# Patient Record
Sex: Female | Born: 1945 | Race: White | Hispanic: No | Marital: Married
Health system: Midwestern US, Academic
[De-identification: ages and names within clinical notes are randomized; demographics above are authoritative.]

---

## 2019-04-27 MED ORDER — LACTATED RINGERS IV SOLP
1000 mL | INTRAVENOUS | 0 refills | Status: CN
Start: 2019-04-27 — End: ?

## 2019-05-07 ENCOUNTER — Encounter: Admit: 2019-05-07 | Discharge: 2019-05-07 | Payer: MEDICARE

## 2019-05-07 ENCOUNTER — Ambulatory Visit: Admit: 2019-05-07 | Discharge: 2019-05-07 | Payer: MEDICARE

## 2019-05-07 MED ORDER — FENTANYL CITRATE (PF) 50 MCG/ML IJ SOLN
0 refills | Status: DC
Start: 2019-05-07 — End: 2019-05-07
  Administered 2019-05-07: 17:00:00 25 ug via INTRAVENOUS

## 2019-05-07 MED ORDER — LACTATED RINGERS IV SOLP
1000 mL | INTRAVENOUS | 0 refills | Status: DC
Start: 2019-05-07 — End: 2019-05-07
  Administered 2019-05-07: 16:00:00 1000.000 mL via INTRAVENOUS

## 2019-05-07 MED ORDER — PROPOFOL 10 MG/ML IV EMUL 20 ML (INFUSION)(AM)(OR)
INTRAVENOUS | 0 refills | Status: DC
Start: 2019-05-07 — End: 2019-05-07
  Administered 2019-05-07: 17:00:00 125 ug/kg/min via INTRAVENOUS

## 2019-05-07 MED ORDER — LIDOCAINE (PF) 200 MG/10 ML (2 %) IJ SYRG
0 refills | Status: DC
Start: 2019-05-07 — End: 2019-05-07
  Administered 2019-05-07: 17:00:00 80 mg via INTRAVENOUS

## 2019-05-07 MED ORDER — ACETAMINOPHEN/LIDOCAINE/ANTACID DS(#) 1:1:3  PO SUSP
30 mL | Freq: Once | ORAL | 0 refills | Status: CP
Start: 2019-05-07 — End: ?
  Administered 2019-05-07: 19:00:00 30 mL via ORAL

## 2019-05-07 NOTE — Anesthesia Post-Procedure Evaluation
Post-Anesthesia Evaluation    Name: Cassandra Wallace      MRN: [id number]     DOB: 05/28/1945     Age: 74 y.o.     Sex: female   __________________________________________________________________________     Procedure Information     Anesthesia Start Date/Time: 05/07/19 1133    Procedure: ESOPHAGOGASTRODUODENOSCOPY WITH TRANSENDOSCOPIC ULTRASOUND GUIDED FINE NEEDLE ASPIRATION/ BIOPSY (N/A )    Location: ENDO 1 / ENDO/GI    Surgeons: Comer Locket, MD          Post-Anesthesia Vitals  BP: 142/82 (05/06 1330)  Temp: 36.8 ?C (98.2 ?F) (05/06 1248)  Pulse: 63 (05/06 1330)  Respirations: 16 PER MINUTE (05/06 1330)  SpO2: 98 % (05/06 1330)  SpO2 Pulse: 64 (05/06 1330)   Vitals Value Taken Time   BP 142/82 05/07/19 1330   Temp 36.8 ?C (98.2 ?F) 05/07/19 1248   Pulse 63 05/07/19 1330   Respirations 16 PER MINUTE 05/07/19 1330   SpO2 98 % 05/07/19 1330         Post Anesthesia Evaluation Note    Evaluation location: other  Patient participation: recovered; patient participated in evaluation  Level of consciousness: alert    Pain score: 0  Pain management: adequate    Hydration: normovolemia  Temperature: 36.0?C - 38.4?C  Airway patency: adequate    Perioperative Events       Post-op nausea and vomiting: no PONV    Postoperative Status  Cardiovascular status: hemodynamically stable  Respiratory status: spontaneous ventilation        Perioperative Events  Perioperative Event: No  Emergency Case Activation: No

## 2019-05-07 NOTE — Anesthesia Pre-Procedure Evaluation
Anesthesia Pre-Procedure Evaluation    Name: Cassandra Wallace      MRN: [id number]     DOB: [birth year]     Age: 74 y.o.     Sex: female   _________________________________________________________________________     Procedure Info:   Procedure Information     Date/Time: 05/07/19 1100    Procedure: ESOPHAGOGASTRODUODENOSCOPY WITH TRANSENDOSCOPIC ULTRASOUND GUIDED FINE NEEDLE ASPIRATION/ BIOPSY (N/A )    Location: ENDO 1 / ENDO/GI    Surgeons: Comer Locket, MD          Physical Assessment  Vital Signs (last filed in past 24 hours):         Patient History   No Known Allergies     Current Medications    Not on File         Review of Systems/Medical History        PONV Screening: Female gender and Non-smoker  No history of anesthetic complications  No family history of anesthetic complications      Airway - negative        Pulmonary - negative      Not a current smoker        No recent URI      Cardiovascular         Exercise tolerance: >4 METS      No hypertension,       No past MI,       No hx of coronary artery disease      No angina      GI/Hepatic/Renal         No hx of liver disease      No nausea      No vomiting      Pancreatic and liver masses seen on imaging at outside hospital - suspicious for cancer        Neuro/Psych       No seizures      No CVA      Parkinson's Disease (stable for past 5 years)        Endocrine/Other - negative      Constitution - negative   Physical Exam    Airway Findings      Mallampati: I      TM distance: >3 FB      Neck ROM: full      Mouth opening: good    Dental Findings: Negative      Cardiovascular Findings:       Rhythm: regular      Rate: normal      No murmur    Pulmonary Findings:       Breath sounds clear to auscultation.    Neurological Findings:       Alert and oriented x 3    Constitutional findings: Negative       Diagnostic Tests  Hematology: No results found for: HGB, HCT, PLTCT, WBC, NEUT, ANC, LYMPH, ALC, ABSLYMPHCT, MONA, AMC, EOSA, ABC, BASOPHILS, MCV, MCH, MCHC, MPV, RDW      General Chemistry: No results found for: NA, K, CL, CO2, GAP, BUN, CR, GLU, CA, KETONES, ALBUMIN, LACTIC, OBSCA, MG, TOTBILI, TOTBILCB, PO4   Coagulation: No results found for: PT, PTT, INR      Anesthesia Plan    ASA score: 2   Plan: MAC  Induction method: intravenous  NPO status: acceptable      Informed Consent  Anesthetic plan and risks discussed with patient.  Use of blood products discussed with patient  Blood Consent: consented

## 2019-05-08 ENCOUNTER — Encounter: Admit: 2019-05-08 | Discharge: 2019-05-08 | Payer: MEDICARE

## 2019-05-09 ENCOUNTER — Encounter: Admit: 2019-05-09 | Discharge: 2019-05-09 | Payer: MEDICARE

## 2019-05-09 NOTE — Progress Notes
Name: Cassandra Wallace          MRN: [id number]      DOB: 1945/06/29      AGE: 74 y.o.   DATE OF SERVICE: 05/11/2019    Subjective:             Reason for Visit:  New CA Pt      Cassandra Wallace is a 74 y.o. female.     Cancer Staging  Pancreatic cancer metastasized to liver [facility])  Staging form: Exocrine Pancreas, AJCC 8th Edition  - Clinical stage from 05/09/2019: Stage IV (cT4, cN0, pM1) - Signed by Lady Deutscher, MD on 05/09/2019      Cassandra Wallace is here for consult regarding her recent finding of adenocarcinoma from the pancreatic and liver biopsies.    Lyrica is being evaluated for abdominal pain and constipation and unintentional weight loss.  Unfortunately a CT of the abdomen and pelvis done in [place] showed pancreatic lesions as well as 3 right hepatic lesions.  An MRI saw the same lesions.  An EUS performed on May 07, 2019 at [facility] unfortunately found to abnormal lesions in the left lobe of the liver and a pancreatic mass in the neck the pancreas was invading the superior mesenteric artery.  Biopsies of both came back consistent with adenocarcinoma.  A CA 19-9 came back as [name] which is elevated.  Her chemistries were normal.  We note that her microsatellite instability tests are not back yet.    Cassandra Wallace is here with her husband Cassandra Wallace and her son Cassandra Wallace, who is a Land here in town, participated by phone.  I believe there are other children in the family.    I discussed the finding of adenocarcinoma in the pancreas and the liver.  I discussed that this is stage IV and not generally considered curable.  I did discuss that sometimes we can slow the cancer on help people live longer with systemic chemotherapy.  I discussed that at this time radiation therapy and surgery would probably not help her live longer do better though there might be a role in the future in certain circumstances.  I discussed several types of systemic therapy.  We discussed no therapy, Gemzar single agent with a minimal response rate and probably minimal survival benefit, a recipe of Gemzar and Abraxane with a 20% response rate, and also modified FOLFIRINOX with a much higher response rate though with much more potential risks and side effects.  This patient is very healthy for her age so to speak.  I think she could probably tolerate a dose adjusted FOLFIRINOX well and get more benefit.  We talked in general about the risk for infections, low blood counts, fatigue, neuropathy, mucositis, dyspepsia and nausea, and hair loss.  We also discussed that we will arrange for a teaching session to go over these in more potential side effects as far as educational plan.  We also discussed the need for a PowerPort placement.  We note that the patient and her husband plan to move here from [facility].    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  He mostly worked in [place].  His name is Czechoslovakian/Bohemian.  We talked about ethnic foods.  Cassandra Wallace on the other hand is originally [place] and her ethnicity.  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  Cassandra Wallace enjoys plan to move here from [place] and breast and will probably be several miles from the office.    In addition to the history of metastatic pancreatic cancer diagnosis the patient also has a history of Parkinson's though she has been very stable.    The patient and her son and husband asked several questions.  They are in agreement with setting things in motion for a PowerPort placement, chemotherapy insurance acquisition and setting things in motion begin chemotherapy hopefully next week.  We talked about following her tumor marker and CAT scan to assess response.  They will call if they have more questions.           Review of Systems   Constitutional: Positive for appetite change and unexpected weight change. Negative for chills and fever.   HENT: Negative for nosebleeds.    Eyes: Negative for pain and visual disturbance.   Respiratory: Negative for cough and shortness of breath.    Cardiovascular: Negative for chest pain and leg swelling.   Gastrointestinal: Positive for constipation. Negative for abdominal pain.   Genitourinary: Negative for hematuria.   Musculoskeletal: Negative for joint swelling and neck pain.   Skin: Negative for rash.   Neurological: Negative for dizziness and weakness.   Hematological: Negative for adenopathy.   Psychiatric/Behavioral: Negative for confusion.         Objective:         ? carbidopa/levodopa (SINEMET) 25/100 mg tablet Take 1 tablet by mouth three times daily.     Vitals:    05/11/19 1345   BP: 132/59   BP Source: Arm, Left Upper   Patient Position: Sitting   Pulse: 95   Resp: 16   Temp: 36.7 ?C (98 ?F)   TempSrc: Oral   SpO2: 100%   Weight: 70.9 kg (156 lb 6.4 oz)   Height: 161.3 cm (63.5)   PainSc: Six     Body mass index is 27.27 kg/m?Marland Kitchen     Medical History:   Diagnosis Date   ? Back pain    ? Parkinson's disease (HCC) 2016     Surgical History:   Procedure Laterality Date   ? CATARACT REMOVAL Bilateral 2020   ? ESOPHAGOGASTRODUODENOSCOPY WITH TRANSENDOSCOPIC ULTRASOUND GUIDED FINE NEEDLE ASPIRATION/ BIOPSY N/A 05/07/2019    Performed by Comer Locket, MD at [facility] ENDO      Social History     Tobacco Use   ? Smoking status: Never Smoker   ? Smokeless tobacco: Never Used   Substance Use Topics   ? Alcohol Use     Frequency: Never   ? Drug use: Never     Family History   Problem Relation Age of Onset   ? Cancer-Uterine Mother    ? Arthritis-rheumatoid Father          Pain Score: Six  Pain Loc: Back    Fatigue Scale: 0-None    Pain Addressed:  Patient declines intervention    Patient Evaluated for a Clinical Trial: No treatment clinical trial available for this patient.     [name] Cooperative Oncology Group performance status is 0, Fully active, able to carry on all pre-disease performance without restriction.Marland Kitchen     Physical Exam  Vitals signs reviewed.   Constitutional:       Appearance: She is well-developed.   HENT:      Head: Normocephalic and atraumatic.   Eyes:      Conjunctiva/sclera: Conjunctivae normal.   Neck:  Musculoskeletal: Normal range of motion.      Vascular: No JVD.      Trachea: No tracheal deviation.   Cardiovascular:      Rate and Rhythm: Normal rate and regular rhythm.      Heart sounds: No friction rub. No gallop.    Pulmonary:      Effort: Pulmonary effort is normal. No respiratory distress.      Breath sounds: Normal breath sounds. No stridor. No wheezing or rales.   Abdominal:      General: There is no distension.      Palpations: Abdomen is soft. There is no mass.      Tenderness: There is no abdominal tenderness.   Musculoskeletal: Normal range of motion.   Lymphadenopathy:      Head:      Right side of head: No submental or submandibular adenopathy.      Left side of head: No submental or submandibular adenopathy.      Cervical: No cervical adenopathy.      Right cervical: No superficial or posterior cervical adenopathy.     Left cervical: No superficial or posterior cervical adenopathy.      Upper Body:      Right upper body: No supraclavicular or epitrochlear adenopathy.      Left upper body: No supraclavicular or epitrochlear adenopathy.   Skin:     General: Skin is warm and dry.      Findings: No rash.   Neurological:      Mental Status: She is alert and oriented to person, place, and time.      Cranial Nerves: No cranial nerve deficit.   Psychiatric:         Behavior: Behavior normal.         Thought Content: Thought content normal.         Judgment: Judgment normal.               Assessment and Plan:  Patient Active Problem List    Diagnosis Date Noted   ? Pancreatic cancer metastasized to liver [facility]) 05/07/2019     Priority: Medium     04/13/2019 [facility] medical imaging [facility] CT abdomen pelvis with contrast: Abdominal pain constipation unintentional weight loss.  The head of the pancreas is 3 hypodense lesions.  The lesion towards the right lateral side measures 1.8 cm.  Anterior lesion the mild/mid head measures 11.4 cm.  There are 3 right hepatic lesions that are suspicious.  Impression: Multifocal pancreas lesion suspicious for tumor such as adenocarcinoma.  Could also be focal inflammation from subacute or chronic pancreatitis.  04/20/2019 [place] medical imaging [facility] MRI abdomen without and with contrast.  Indeterminate lesions in the pancreatic bed and proximal body.  Pancreatic neoplasm cannot be excluded.  This is compared to CT/12/2019.  Several right and left hepatic lesions are present.  The 2 largest are adjacent to 1 another.  Measuring 2.1 x 1.8 cm.  Concerning for hepatic metastatic lesions.  Heterogeneous process pancreatic head and proximal body the whole area measures approximately 5.2 x 3.5 cm.  It appears to be least some encasement of the associated narrowing of the superior mesenteric vein  04/23/2019 [facility] clinic note.  Patient had Covid in November 2020.  Recent imaging showed spots on pancreas.  History of Parkinson's weight 156 pounds  05/07/2019 [facility] EUS report examination of the left lobe of the liver revealed 2 lesions measuring 1 cm each.  FNA x4 was performed upper  limit result is positive for malignant cells.  Examination of pancreatic revealed a mass in the neck the pancreas with invasion to SMA.  Fine-needle biopsy was obtained and preliminary is all is positive for malignant cells.  Await cytology results and await path results.  05/07/2019 CA 19-9 1782, H 12.3, platelets 120 low, W3.5 low, chemistries normal,  05/07/2019 [facility] pathology/cytology liver US-FNA malignant cells present consistent with adenocarcinoma.  Pancreatic neck EUS FNA: Malignant cells present consistent with adenocarcinoma.  Mismatch repair proteins are ordered and will reported in an addendum.  Microsatellite instability test by PCR is also been ordered and will be reported separately.  Part A contain sufficient tissue for further testing.  05/11/2019 this is patient's first visit with [facility] oncology.  Husband Cassandra Wallace participated in person son Cassandra Wallace who is a Land, by phone.  Discussed biopsy results and also stage and prognosis of stage IV pancreatic cancer.  Talked about treatment options including FOLFIRINOX with growth factor, Gemzar and Abraxane, single agent Gemzar, no therapy/palliative care.  Patient is very good performance status.  Would suggest slightly dose reduced FOLFIRINOX with growth factor.  Patient will think about it but is willing to set things in motion for PowerPort placement and chemotherapy and teaching etc.  They plan to move here from [facility] in the near future.      Lequita Asal, family medicine with [place] [facility]       ? Parkinson's disease (HCC) 05/11/2019     This note was created with partial dictation using a dragon dictation system, please notify us if you notice any errors of omissions or content.

## 2019-05-11 ENCOUNTER — Encounter: Admit: 2019-05-11 | Discharge: 2019-05-11 | Payer: MEDICARE

## 2019-05-11 DIAGNOSIS — C259 Malignant neoplasm of pancreas, unspecified: Secondary | ICD-10-CM

## 2019-05-11 DIAGNOSIS — G20 Parkinson's disease: Secondary | ICD-10-CM

## 2019-05-11 DIAGNOSIS — M549 Dorsalgia, unspecified: Secondary | ICD-10-CM

## 2019-05-11 MED ORDER — IRINOTECAN IVPB
135 mg/m2 | Freq: Once | INTRAVENOUS | 0 refills | Status: CN
Start: 2019-05-11 — End: ?

## 2019-05-11 MED ORDER — OXALIPLATIN IVPB
76.5 mg/m2 | Freq: Once | INTRAVENOUS | 0 refills | Status: CN
Start: 2019-05-11 — End: ?

## 2019-05-11 MED ORDER — PALONOSETRON 0.25 MG/5 ML IV SOLN
.25 mg | Freq: Once | INTRAVENOUS | 0 refills | Status: CN
Start: 2019-05-11 — End: ?

## 2019-05-11 MED ORDER — DEXAMETHASONE 6 MG PO TAB
12 mg | Freq: Once | ORAL | 0 refills | Status: CN
Start: 2019-05-11 — End: ?

## 2019-05-11 MED ORDER — LEUCOVORIN IVPB
360 mg/m2 | Freq: Once | INTRAVENOUS | 0 refills | Status: CN
Start: 2019-05-11 — End: ?

## 2019-05-11 MED ORDER — FLUOROURACIL IV AMB PUMP
2160 mg/m2 | Freq: Once | INTRAVENOUS | 0 refills | Status: CN
Start: 2019-05-11 — End: ?

## 2019-05-11 MED ORDER — ATROPINE 0.4 MG/ML IJ SOLN
0.4 mg | INTRAVENOUS | 0 refills | Status: CN | PRN
Start: 2019-05-11 — End: ?

## 2019-05-11 NOTE — Patient Education
Dear Ms. Cassandra Wallace,     Thank you for choosing [facility] Interventional Radiology for your procedure. Your appointment information is listed below:    Appointment Date: 05/13/19  Appointment Time: 2:00pm   Arrival Time: 1:00pm  Location:   ? [facility]: [address] [place], [place] [address]   Parking: available in the front of the building      INTERVENTIONAL RADIOLOGY  PRE-PROCEDURE INSTRUCTIONS SEDATION    You are scheduled for a procedure in Interventional Radiology with procedural sedation.  Please follow these instructions and any direction from your Primary Care/Managing Physician.  If you have questions about your procedure or need to reschedule please call [phone].    Medication Instructions:   You may take the following medications with a small sip of water:  <ALL MEDICATIONS>       Diet Instructions:  a. (8) hours before your procedure (6:00am), stop your regular diet and start a clear liquid diet.  b. (6) hours before your procedure, discontinue tube feedings and chewing tobacco.  c. (2) hours before your procedure (12:00pm/noon) discontinue clear liquids.  You should have nothing by mouth. This includes GUM or CANDY.     Clear Liquid Diet    Water  Apple or White Grape Juice  Coffee or tea without cream   Tea  White Cranberry Juice  Chicken Bouillon or Broth (no noodles)  Soda Pop  Popsicles   Beef Bouillon or Broth (no noodles)    Day of Exam Instructions:  1. Bathe or shower with an antibacterial soap prior to your appointment.  2. If you have a history of Obstructive Sleep Apnea (OSA) bring your CPAP/BIPAP.   3. Bring a list of your current medications and the dosages.  4. Wear comfortable clothing and leave valuables at home.  5. Arrive (1) hour prior to your appointment.  This time will be spent registering, interviewing, assessing, educating and preparing you for the test.  ? You will be with [place] anywhere from 30 minutes to 6 hours after your exam depending on your procedure.  6. You may be sedated for the procedure. A responsible adult must drive you home (no Benedetto Goad, taxis or buses are allowed) and stay with you overnight. If you do not have a driver we will be unable to perform your procedure.   7. You will not be able to return to work or drive the same day if receiving sedation.

## 2019-05-11 NOTE — Progress Notes
Interventional Radiology Outpatient Scheduling Checklist      1.  Name of Procedure(s):  Portacath       2.  Date of Procedure:   05/13/19      3.  Arrival Time:   1300      4.  Procedure Time:  1400      5.  Correct Procedural Room Assignment:  ICC RM 1      6.  Blood Thinners Triaged and instructed per protocol: Y/N/NA:  NA  Confirmed accurate instructions sent to patient: Y/N:  NA       7.  Procedure Order Verified: Y/N:  Yes      9.  Patient instructed to have a driver: Y/N/NA:  Yes    10.  Patient instructed on NPO status: Y/N/NA:  Yes, 0600 & 1200  Confirmed accurate instructions sent to patient: Y/N:  Yes    11.  Specimen needed: Y/N/NA:  NA   Verified Order placed: Y/N:  Yes    12.  Allergies Verified:  Y/N:  Yes    13.  Is there an Iodine Allergy: Y/N:  No  Does the Procedure Require contrast: Y/N:  Possibly  If so, was the IR- Contrast Allergy Pre-Procedure Medication protocol ordered: Y/NA:  NA    14.  Does the patient have labs according to IR Pre-procedure Laboratory Parameter policy: Y/N/NA: NA  If No, was the patient instructed to obtain labs prior to procedure: Y/N/NA:  NA     15.  Will the patient need to be admitted or have a possible admission: Y/N:  No  If yes, confirmed accurate instructions sent to patient: Y/N/NA:  NA     16.  Patient States Understanding: Y/N:  Yes    17.  History of OSA:  Y/N:  No  If yes, confirm request to bring CPAP sent to patient: Y/N/NA:  NA    18. Patient declines electronic procedure instructions: Y/N:  No, EMAIL.

## 2019-05-13 ENCOUNTER — Encounter: Admit: 2019-05-13 | Discharge: 2019-05-13 | Payer: MEDICARE

## 2019-05-13 ENCOUNTER — Ambulatory Visit: Admit: 2019-05-13 | Discharge: 2019-05-13 | Payer: MEDICARE

## 2019-05-13 DIAGNOSIS — C787 Secondary malignant neoplasm of liver and intrahepatic bile duct: Secondary | ICD-10-CM

## 2019-05-13 DIAGNOSIS — M549 Dorsalgia, unspecified: Secondary | ICD-10-CM

## 2019-05-13 DIAGNOSIS — C259 Malignant neoplasm of pancreas, unspecified: Secondary | ICD-10-CM

## 2019-05-13 DIAGNOSIS — G20 Parkinson's disease: Secondary | ICD-10-CM

## 2019-05-13 MED ORDER — MIDAZOLAM 1 MG/ML IJ SOLN
0 refills | Status: CP
Start: 2019-05-13 — End: ?
  Administered 2019-05-13: 20:00:00 0.5 mg via INTRAVENOUS
  Administered 2019-05-13: 19:00:00 1 mg via INTRAVENOUS

## 2019-05-13 MED ORDER — FENTANYL CITRATE (PF) 50 MCG/ML IJ SOLN
0 refills | Status: CP
Start: 2019-05-13 — End: ?
  Administered 2019-05-13 (×2): 50 ug via INTRAVENOUS

## 2019-05-13 MED ORDER — SODIUM CHLORIDE 0.9 % IV SOLP
0 refills | Status: CP
Start: 2019-05-13 — End: ?
  Administered 2019-05-13: 19:00:00 250 mL via INTRAVENOUS

## 2019-05-13 MED ORDER — MIDAZOLAM 1 MG/ML IJ SOLN
1 mg | Freq: Once | INTRAVENOUS | 0 refills | Status: CP
Start: 2019-05-13 — End: ?
  Administered 2019-05-13: 19:00:00 1 mg via INTRAVENOUS

## 2019-05-13 NOTE — Other
Immediate Post Procedure Note    Date:  05/13/2019                                         Attending Physician:   Val Eagle  Performing Provider:  Dion Saucier, MD    Consent:  Consent obtained from patient.  Time out performed: Consent obtained, correct patient verified, correct procedure verified, correct site verified, patient marked as necessary.  Pre/Post Procedure Diagnosis:  Pancreatic cancer  Indications:  chemo    Anesthesia: Conscious Sedation  Procedure(s):  Right chest port placement  Findings:  Patent right IJ     Estimated Blood Loss:  None/Negligible  Specimen(s) Removed/Disposition:  None  Complications: None  Patient Tolerated Procedure: Well  Post-Procedure Condition:  stable    Dion Saucier, MD  Pager [phone]

## 2019-05-13 NOTE — Progress Notes
Sedation physician present in room. Recent vitals and patient condition reviewed between sedating physician and nurse. Reassessment completed. Determination made to proceed with planned sedation.

## 2019-05-13 NOTE — H&P (View-Only)
Pre Procedure History and Physical/Sedation Plan-OP    Procedure Date: 05/13/2019     Planned Procedure(s):  Chest port placement  Indication:  chemo    __________________________________________________________________    Chief Complaint:  Pancreatic cancer    History of Present Illness: Cassandra Wallace is a 74 y.o. female with metastatic pancreatic cancer.  Patient Active Problem List    Diagnosis Date Noted   ? Parkinson's disease (HCC) 05/11/2019   ? Pancreatic cancer metastasized to liver [facility]) 05/07/2019     Medical History:   Diagnosis Date   ? Back pain    ? Parkinson's disease (HCC) 2016      Surgical History:   Procedure Laterality Date   ? CATARACT REMOVAL Bilateral 2020   ? ESOPHAGOGASTRODUODENOSCOPY WITH TRANSENDOSCOPIC ULTRASOUND GUIDED FINE NEEDLE ASPIRATION/ BIOPSY N/A 05/07/2019    Performed by Comer Locket, MD at [facility] ENDO      Medications Prior to Admission   Medication Sig Dispense Refill Last Dose   ? carbidopa/levodopa (SINEMET) 25/100 mg tablet Take 1 tablet by mouth three times daily.        No Known Allergies    Social History:   Social History     Tobacco Use   ? Smoking status: Never Smoker   ? Smokeless tobacco: Never Used   Substance Use Topics   ? Alcohol Use     Frequency: Never      Family History   Problem Relation Age of Onset   ? Cancer-Uterine Mother    ? Arthritis-rheumatoid Father         Review of Systems  A comprehensive review of systems was negative.    Previous Anesthetic/Sedation History:  No complications    Physical Exam:  Vital Signs: Last Filed In 24 Hours Vital Signs: 24 Hour Range                General appearance: alert and cooperative  Neurologic: Grossly normal  Lungs: clear to auscultation bilaterally  Heart: normal apical impulse  Abdomen: soft, non-tender. Bowel sounds normal. No masses,  no organomegaly  Extremities: extremities normal, atraumatic, no cyanosis or edema      Airway:  airway assessment performed  Mallampati II (soft palate, uvula, fauces visible) Anesthesia Classification:  ASA III (A patient with a severe systemic disease that limits activity, but is not incapacitating)  Mallampati:  Sedation/Medication Plan: Fentanyl, Lidocaine and Midazolam  Discussion/Reviews:  Physician has discussed risks and alternatives of this type of sedation and above planned procedures with patient    Lab/Radiology/Other Diagnostic Tests:  Labs:  Pertinent labs reviewed           Dion Saucier, MD  Pager [phone]

## 2019-05-13 NOTE — Patient Instructions
Interventional Radiology  IMPLANTED PORT PLACEMENT  An implanted port is a small intravenous access device placed completely under the skin through which you may receive chemotherapy, other medications or blood products.? It may also be used to draw blood.? The port consists of a small reservoir that is implanted usually under the skin of your chest.? This reservoir is connected to a catheter that is placed in a ?tunnel? under the skin and ends in a large vein near the center of your chest.? You may also hear it referred to as a ?chest port,? ?power port? or ?portacath.?? A ?power port? is a port that can be used for contrast injections for CT scans.? A ?vortex port??may be?used for photopheresis. In some cases, the port may be placed in the upper arm.  POST-PROCEDURE ACTIVITY:  ? A responsible adult must drive you home.? After receiving sedation or anesthesia, ?you should not drive, operate heavy machinery or do anything that requires concentration for at least 24 hours after receiving sedation.  ? It is recommended that a responsible adult be with you until morning.  ? Do not lift more than 5 lbs. and avoid any strenuous activity affecting the upper body such as pushing, pulling or straining for 10 days.  ? If the port was placed in your arm, do not allow blood pressures to be taken in that arm.  POST-PROCEDURE SITE CARE:  ? You will have a bandage over the incision on your chest and another small bandage at your neck.? Keep the bandages dry.? You may remove the small bandage at your neck in 24 hours and leave open to air.? Remove the bandage over the incision on your chest after 48 hours.  ? After 24 hours, you may shower. ?Keep the site covered and avoid direct water contact to the incision.?  ? After 48 hours, remove dressing to shower. Use antibacterial soap, gently wash the site then pat dry after.  ? There are no stitches to be removed.? Steri-strips (strips of tape) may be used.?? If present the strips will begin to fall off in 7-10 days.? If they remain after 2 weeks, gently remove them.  ? Do not submerge the area underwater for 1 week or until fully healed (no swimming/hot tub, etc.)  ? Be sure your hands are clean when touching near the site.  ? Do not use ointments, creams or powders on the incision.  ? If you are admitted to the hospital, you will be taught to wash with Chlorhexidine (CHG) soap.? This soap reduces germs on your skin and lowers your risk of infection while in the hospital.? It will keep harmful germs off your skin for 24 hours, so it is important to use this soap daily.? The nursing staff will teach you how to shower with this soap.? If you are unable to shower, they will assist you with using it during a bed bath.? It is not necessary to continue using this soap at home.  ? This soap can cause dry skin.? We recommend using lotion that is compatible with CHG after each bath or shower.? The nursing staff can provide you with our recommended lotion in the hospital.  DIET/MEDICATIONS:  ? You may resume your previous diet after the procedure.?  ? If you receive sedation or anesthesia, avoid any foods or beverages containing alcohol for at least 24 hours after the procedure.?  ? Please see the Medication Reconciliation sheet for instructions regarding resuming your home medications.  CALL THE DOCTOR IF:??????????????????  ?  Bright red blood has soaked the bandage.?  ? You have pain not relieved by medication.? Some soreness at the site is to be expected.  ? You have? Chills, body aches, fever greater than 101?F  ? Redness, swelling or warmth at or around the incision?  ? Drainage or pus coming from the incision  ? Increased tenderness around the incision  ? You have opening of the edges of the incision.  ? You have swelling of the face, neck, chest or arm on the side where the port was placed.????  ?  For severe problems such as excessive bleeding, chest pain or shortness of breath, please call 911.  ?  For any of the above symptoms or for problems or concerns related to the procedure,? call? [phone] for Monday-Friday 7-5.? After-hours and weekends, please call? [phone] and ask for the Interventional Radiology Resident on-call.

## 2019-05-14 ENCOUNTER — Encounter: Admit: 2019-05-14 | Discharge: 2019-05-14 | Payer: MEDICARE

## 2019-05-14 DIAGNOSIS — C259 Malignant neoplasm of pancreas, unspecified: Secondary | ICD-10-CM

## 2019-05-14 MED ORDER — SODIUM CHLORIDE 0.9 % IJ SOLN
50 mL | Freq: Once | INTRAVENOUS | 0 refills | Status: CP
Start: 2019-05-14 — End: ?
  Administered 2019-05-14: 20:00:00 50 mL via INTRAVENOUS

## 2019-05-14 MED ORDER — IOHEXOL 350 MG IODINE/ML IV SOLN
70 mL | Freq: Once | INTRAVENOUS | 0 refills | Status: CP
Start: 2019-05-14 — End: ?
  Administered 2019-05-14: 20:00:00 70 mL via INTRAVENOUS

## 2019-05-15 NOTE — Progress Notes
This serves as a baseline.  We will see if these get smaller or bigger on chemotherapy to help [place] understand what they are.  No change in plan at this time.

## 2019-05-18 ENCOUNTER — Encounter: Admit: 2019-05-18 | Discharge: 2019-05-18 | Payer: MEDICARE

## 2019-05-19 ENCOUNTER — Encounter: Admit: 2019-05-19 | Discharge: 2019-05-19 | Payer: MEDICARE

## 2019-05-19 ENCOUNTER — Encounter: Admit: 2019-05-19 | Discharge: 2019-06-10 | Payer: MEDICARE

## 2019-05-19 DIAGNOSIS — G20 Parkinson's disease: Secondary | ICD-10-CM

## 2019-05-19 DIAGNOSIS — Z719 Counseling, unspecified: Secondary | ICD-10-CM

## 2019-05-19 DIAGNOSIS — C259 Malignant neoplasm of pancreas, unspecified: Secondary | ICD-10-CM

## 2019-05-19 DIAGNOSIS — M549 Dorsalgia, unspecified: Secondary | ICD-10-CM

## 2019-05-19 MED ORDER — LOPERAMIDE 2 MG PO CAP
ORAL_CAPSULE | 3 refills | Status: CN
Start: 2019-05-19 — End: ?

## 2019-05-19 MED ORDER — LIDOCAINE-PRILOCAINE 2.5-2.5 % TP CREA
3 refills | Status: AC
Start: 2019-05-19 — End: ?

## 2019-05-19 MED ORDER — ONDANSETRON HCL 8 MG PO TAB
8 mg | ORAL_TABLET | ORAL | 3 refills | 8.00000 days | Status: AC | PRN
Start: 2019-05-19 — End: ?

## 2019-05-19 MED ORDER — LOPERAMIDE 2 MG PO CAP
ORAL_CAPSULE | ORAL | 3 refills | 10.00000 days | Status: AC
Start: 2019-05-19 — End: ?

## 2019-05-19 MED ORDER — ONDANSETRON HCL 8 MG PO TAB
8 mg | ORAL_TABLET | ORAL | 3 refills | Status: CN | PRN
Start: 2019-05-19 — End: ?

## 2019-05-19 NOTE — Progress Notes
Name: Cassandra Wallace          MRN: [id number]      DOB: 1945-06-04      AGE: 74 y.o.   DATE OF SERVICE: 05/19/2019    Subjective:             Reason for Visit:  Pt Ed      Cassandra Wallace is a 74 y.o. female.     Cancer Staging  Pancreatic cancer metastasized to liver [facility])  Staging form: Exocrine Pancreas, AJCC 8th Edition  - Clinical stage from 05/09/2019: Stage IV (cT4, cN0, pM1) - Signed by Lady Deutscher, MD on 05/09/2019      History of Present Illness     Cassandra Wallace is a patient of Dr. Shaune Pascal with the following history:    She has a history of abdominal pain, constipation and unintentional weight loss.  Unfortunately a CT  abdomen and pelvis done in [place] showed pancreatic lesions as well as 3 right hepatic lesions.  An MRI saw the same lesions.  An EUS performed on May 07, 2019 at [facility] found to abnormal lesions in the left lobe of the liver and a pancreatic mass in the neck the pancreas that was invading the superior mesenteric artery. Biopsies of both came back consistent with adenocarcinoma. A CA 19-9 came back as [name] which is elevated.      Plan FOLFIRINOX to start on 05/20/19.    Interval Events:   Cassandra Wallace presents to clinic for education about her treatment with FOLFIRINOX. She is unaccompanied to today's visit.    Medical History:   Diagnosis Date   ? Back pain    ? Pancreatic cancer (HCC)     mets to liver   ? Parkinson's disease (HCC) 2016     Surgical History:   Procedure Laterality Date   ? CATARACT REMOVAL Bilateral 2020   ? ESOPHAGOGASTRODUODENOSCOPY WITH TRANSENDOSCOPIC ULTRASOUND GUIDED FINE NEEDLE ASPIRATION/ BIOPSY N/A 05/07/2019    Performed by Comer Locket, MD at [facility]              Review of Systems      Objective:         ? carbidopa/levodopa (SINEMET) 25/100 mg tablet Take 1 tablet by mouth three times daily.   ? lidocaine/prilocaine (EMLA) 2.5/2.5 % topical cream Apply a thin film over port one hour prior to treatment. Do not rub in. Cover with plastic wrap after application   ? loperamide (IMODIUM A-D) 2 mg capsule Take 2 capsules by mouth after first loose/frequent bowel movement, then 1 capsule every 2 hours (2 capsules every 4 hours at night) until 12 hours have passed without a bowel movement.   ? ondansetron (ZOFRAN) 8 mg tablet Take one tablet by mouth every 8 hours as needed for Nausea.     Vitals:    05/19/19 0850   BP: 115/74   BP Source: Arm, Right Upper   Patient Position: Sitting   Pulse: 101   Resp: 16   Temp: 36.6 ?C (97.9 ?F)   TempSrc: Oral   SpO2: 98%   Weight: 69.1 kg (152 lb 6.4 oz)   Height: 161.3 cm (63.5)   PainSc: Ten     Body mass index is 26.57 kg/m?Marland Kitchen     Pain Score: Ten  Pain Loc: Back    Fatigue Scale: 0-None    Pain Addressed:  N/A    Patient Evaluated for a Clinical Trial: No treatment clinical trial available  for this patient.     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 Plan to start FOLFIRINOX on 05/20/19.    APP Chemotherapy Education    IV Chemotherapy: The following is a summary of the patient's IV Chemotherapy Education.    A thorough pre-assessment and teaching session explaining the mechanism of action, possible side effects, precautions and instructions regarding FOLFIRINOX for control intent was conducted. The patient will return on 05/20/19 to initiate treatment. The cycle will repeat every 14 days unless disease progresses or unacceptable toxicity is reached. .  Plan of administration was reviewed.      Both written and verbal information were given to the patient.    The planned course of treatment, anticipated benefits, material risks and potential side effects that may occur with this course of treatment were explained to the patient.  Side effects and their management were discussed in detail and include, but are not limited to: Anemia, neutropenia, thrombocytopenia, alopecia, nausea, vomiting, allergic reaction, hand/ foot syndrome, mucositis, constipation, diarrhea, alteration in liver function, numbness/tingling, mucositis, hand/foot syndrome, nail changes, skin rash and sensitivity to cold. Precautions including hand washing, avoiding crowds and avoiding sick contacts were discussed. I instructed her to avoid exposure to the cold including cold beverages, food and cold air (from fridge and freezer). Instructions for when to call clinic/provider on call were also discussed. These circumstances include temperature 100.5 or greater, uncontrolled nausea/vomiting, uncontrolled diarrhea, abdominal pain, mucositis, and/or shortness of breath.     We discussed injection site pain and muscle/bone pain related to pegfilgrastim injection. I recommend taking claritin 10 mg daily x 5 days from day of injection.    Reproductive concerns were not discussed with the patient  Infertility risks were not applicable and therefore not discussed    Appropriate handling of body secretions and waste at home were reviewed as applicable.    Prescriptions for supportive medications including zofran, imodium and EMLA cream were e-scripted to their pharmacy and discussed in detail how to take. Drug to drug interactions were reviewed as applicable.     The patient has received contact information for the clinic and was instructed on when and who to call.     The patient verbalized understanding, was given the opportunity to ask questions, and the consent form was signed.     Follow up appointment with nurse practitioner one week after first treatment with labs at that time.    This was a 50 minute face to face encounter with 50 minutes spent in counseling and coordination of care.    RTC: 05/20/19 for treatment; one week later for toxicity check. Call sooner for questions or concerns.

## 2019-05-20 MED ORDER — DEXAMETHASONE 6 MG PO TAB
12 mg | Freq: Once | ORAL | 0 refills | Status: CP
Start: 2019-05-20 — End: ?

## 2019-05-20 MED ORDER — LEUCOVORIN IVPB
360 mg/m2 | Freq: Once | INTRAVENOUS | 0 refills | Status: CP
Start: 2019-05-20 — End: ?

## 2019-05-20 MED ORDER — FLUOROURACIL IV AMB PUMP
2160 mg/m2 | Freq: Once | INTRAVENOUS | 0 refills | Status: CP
Start: 2019-05-20 — End: ?

## 2019-05-20 MED ORDER — IRINOTECAN IVPB
135 mg/m2 | Freq: Once | INTRAVENOUS | 0 refills | Status: CP
Start: 2019-05-20 — End: ?

## 2019-05-20 MED ORDER — OXALIPLATIN IVPB
76.5 mg/m2 | Freq: Once | INTRAVENOUS | 0 refills | Status: CP
Start: 2019-05-20 — End: ?

## 2019-05-20 MED ORDER — PALONOSETRON 0.25 MG/5 ML IV SOLN
.25 mg | Freq: Once | INTRAVENOUS | 0 refills | Status: CP
Start: 2019-05-20 — End: ?

## 2019-05-20 MED ORDER — HEPARIN, PORCINE (PF) 100 UNIT/ML IV SYRG
500 [IU] | Freq: Once | 0 refills | Status: DC
Start: 2019-05-20 — End: 2019-05-25

## 2019-05-20 MED ORDER — ATROPINE 0.4 MG/ML IJ SOLN
0.4 mg | INTRAVENOUS | 0 refills | Status: DC | PRN
Start: 2019-05-20 — End: 2019-05-25

## 2019-05-21 ENCOUNTER — Encounter: Admit: 2019-05-21 | Discharge: 2019-05-21 | Payer: MEDICARE

## 2019-05-21 NOTE — Telephone Encounter
Cassandra Wallace was contacted in regard to cycle 1, day 1 chemotherapy.     Patient was asked:    1. Are you experiencing any symptoms or side effects (nausea/vomiting/diarrhea/joint pain)? Nausea intermittently this morning.     2. Are you utilizing your supportive medications? Zofran as prescribed.     3. Are you able to eat and drink? Yes, drinking ensure as well. Educated patient on drinking PO fluids to stay hydrated.     4. Were there any prescriptions you were unable to pick up? No    5. Do you have any questions I can answer for you? No    6. Are you scheduled for your next treatment and/or office visit? Yes,     Cassandra Wallace verbalized understanding and denied further need at this time.

## 2019-05-22 ENCOUNTER — Encounter: Admit: 2019-05-22 | Discharge: 2019-05-22 | Payer: MEDICARE

## 2019-05-22 DIAGNOSIS — C259 Malignant neoplasm of pancreas, unspecified: Secondary | ICD-10-CM

## 2019-05-22 DIAGNOSIS — G20 Parkinson's disease: Secondary | ICD-10-CM

## 2019-05-22 DIAGNOSIS — M549 Dorsalgia, unspecified: Secondary | ICD-10-CM

## 2019-05-22 MED ORDER — PEGFILGRASTIM-CBQV 6 MG/0.6 ML SC SYRG
6 mg | Freq: Once | SUBCUTANEOUS | 0 refills | Status: CP
Start: 2019-05-22 — End: ?
  Administered 2019-05-22: 19:00:00 6 mg via SUBCUTANEOUS

## 2019-05-22 MED ORDER — HEPARIN, PORCINE (PF) 100 UNIT/ML IV SYRG
500 [IU] | Freq: Once | 0 refills | Status: CP
Start: 2019-05-22 — End: ?

## 2019-05-22 NOTE — Progress Notes
Cycle 1 Day 3 DC pump with no residual.    Udenyca given per plan. Patient observed 15 mins with no s/s of reaction.    Patient has no complaints or changes to report.    Central line flushed per protocol.  Encouraged PO Intake and instructed patient to call our office with any problems before next visit.    Discharged in good condition, ambulatory.

## 2019-05-22 NOTE — Patient Instructions
Call Immediately to report the following:  Uncontrolled nausea and/or vomiting, uncontrolled pain, or unusual bleeding.  Temperature of 100.4 F or greater and/or any sign/symptom of infection (redness, warmth, tenderness)  Painful mouth or difficulty swallowing  Red, cracked, or painful hands and/or feet  Diarrhea   Swelling of arms or legs  Rash    Important Phone Numbers:  [facility] Main Number (answered 24 hours a day) [phone]  Cancer Center Scheduling (appointments) [phone] OR [phone]  [facility] (for nutritional supplements) [phone]        Port Maintenance - If you have a port, it should be flushed every 6-8 weeks when not in use.  Please check with your MD, nurse, or the scheduler.

## 2019-05-25 ENCOUNTER — Encounter: Admit: 2019-05-25 | Discharge: 2019-05-25 | Payer: MEDICARE

## 2019-05-26 ENCOUNTER — Encounter: Admit: 2019-05-26 | Discharge: 2019-05-26 | Payer: MEDICARE

## 2019-05-26 DIAGNOSIS — E860 Dehydration: Secondary | ICD-10-CM

## 2019-05-26 DIAGNOSIS — C259 Malignant neoplasm of pancreas, unspecified: Secondary | ICD-10-CM

## 2019-05-26 DIAGNOSIS — M546 Pain in thoracic spine: Secondary | ICD-10-CM

## 2019-05-26 DIAGNOSIS — M549 Dorsalgia, unspecified: Secondary | ICD-10-CM

## 2019-05-26 DIAGNOSIS — R112 Nausea with vomiting, unspecified: Secondary | ICD-10-CM

## 2019-05-26 DIAGNOSIS — R197 Diarrhea, unspecified: Secondary | ICD-10-CM

## 2019-05-26 DIAGNOSIS — R42 Dizziness and giddiness: Secondary | ICD-10-CM

## 2019-05-26 DIAGNOSIS — G20 Parkinson's disease: Secondary | ICD-10-CM

## 2019-05-26 MED ORDER — SODIUM CHLORIDE 0.9 % IV SOLP
1000 mL | Freq: Once | INTRAVENOUS | 0 refills | Status: CN
Start: 2019-05-26 — End: ?

## 2019-05-26 MED ORDER — SODIUM CHLORIDE 0.9 % IV SOLP
1000 mL | Freq: Once | INTRAVENOUS | 0 refills | Status: CP
Start: 2019-05-26 — End: ?
  Administered 2019-05-26: 19:00:00 1000 mL via INTRAVENOUS

## 2019-05-26 MED ORDER — HEPARIN, PORCINE (PF) 100 UNIT/ML IV SYRG
500 [IU] | Freq: Once | 0 refills | Status: CP
Start: 2019-05-26 — End: ?

## 2019-05-26 NOTE — Patient Instructions
Call Immediately to report the following:  Uncontrolled nausea and/or vomiting, uncontrolled pain, or unusual bleeding.  Temperature of 100.4 F or greater and/or any sign/symptom of infection (redness, warmth, tenderness)  Painful mouth or difficulty swallowing  Red, cracked, or painful hands and/or feet  Diarrhea   Swelling of arms or legs  Rash    Important Phone Numbers:  [facility] Main Number (answered 24 hours a day) [phone]  Cancer Center Scheduling (appointments) [phone] OR [phone]  [facility] (for nutritional supplements) [phone]        Port Maintenance - If you have a port, it should be flushed every 6-8 weeks when not in use.  Please check with your MD, nurse, or the scheduler.

## 2019-05-26 NOTE — Progress Notes
Name: Cassandra Wallace          MRN: [id number]      DOB: 1945/09/14      AGE: 74 y.o.   DATE OF SERVICE: 05/26/2019    Subjective:             Reason for Visit:  Follow Up      Cassandra Wallace is a 74 y.o. female.     Cancer Staging  Pancreatic cancer metastasized to liver [facility])  Staging form: Exocrine Pancreas, AJCC 8th Edition  - Clinical stage from 05/09/2019: Stage IV (cT4, cN0, pM1) - Signed by Lady Deutscher, MD on 05/09/2019      History of Present Illness    Cassandra Wallace is a patient of Dr. Shaune Pascal with the following history:    She has a history of abdominal pain, constipation and unintentional weight loss.  Unfortunately a CT  abdomen and pelvis done in [place] showed pancreatic lesions as well as 3 right hepatic lesions.  An MRI saw the same lesions.  An EUS performed on May 07, 2019 at [facility] found to abnormal lesions in the left lobe of the liver and a pancreatic mass in the neck the pancreas that was invading the superior mesenteric artery. Biopsies of both came back consistent with adenocarcinoma. A CA 19-9 came back as Cassandra Wallace which is elevated.      Started FOLFIRINOX  on 05/20/19.    Interval Events:   Cassandra Wallace presents to clinic for toxicity check after receiving her first treatment with FOLFIRINOX. She is unaccompanied to today's visit. She tolerated her treatment fairly well. She has noticed decreased appetite so isn't eating as much. She thinks she is drinking about 40 ounces of water per day. She is also drinking one protein drink per day. She has been avoiding cold beverages and denies any cold neuropathy or peripheral neuropathy symptoms. She had one day of nausea that was treated with zofran. She also had some diarrhea but she took imodium and it resolved. She had some dizziness with position changes but thinks she was laying flat for too long. She has ongoing back pain is wondering about something for that. Her son is a Land and he has adjusted her back with about 24 hours of relief. This pain has been ongoing. She has also tried tylenol without much improvement in pain symptoms.     Medical History:   Diagnosis Date   ? Back pain    ? Pancreatic cancer (HCC)     mets to liver   ? Parkinson's disease (HCC) 2016     Surgical History:   Procedure Laterality Date   ? CATARACT REMOVAL Bilateral 2020   ? ESOPHAGOGASTRODUODENOSCOPY WITH TRANSENDOSCOPIC ULTRASOUND GUIDED FINE NEEDLE ASPIRATION/ BIOPSY N/A 05/07/2019    Performed by Comer Locket, MD at [facility]              Review of Systems   Constitutional: Positive for appetite change and fatigue. Negative for chills and fever.   HENT: Negative.  Negative for mouth sores, rhinorrhea and sore throat.    Eyes: Negative.    Respiratory: Negative.  Negative for cough and shortness of breath.    Cardiovascular: Negative.  Negative for leg swelling.   Gastrointestinal: Positive for diarrhea and nausea. Negative for constipation and vomiting.   Genitourinary: Negative.    Musculoskeletal: Positive for back pain. Negative for joint swelling.   Skin: Negative for rash.   Neurological: Positive for dizziness. Negative for  headaches.   Hematological: Negative for adenopathy.   Psychiatric/Behavioral: Negative.  Negative for confusion.         Objective:         ? carbidopa/levodopa (SINEMET) 25/100 mg tablet Take 1 tablet by mouth three times daily.   ? lidocaine/prilocaine (EMLA) 2.5/2.5 % topical cream Apply a thin film over port one hour prior to treatment. Do not rub in. Cover with plastic wrap after application   ? loperamide (IMODIUM A-D) 2 mg capsule Take 2 capsules by mouth after first loose/frequent bowel movement, then 1 capsule every 2 hours (2 capsules every 4 hours at night) until 12 hours have passed without a bowel movement.   ? ondansetron (ZOFRAN) 8 mg tablet Take one tablet by mouth every 8 hours as needed for Nausea.     Vitals:    05/26/19 1327   BP: 119/74   BP Source: Arm, Right Upper   Patient Position: Sitting   Pulse: 108   Resp: 16   Temp: 36.8 ?C (98.2 ?F)   TempSrc: Oral   SpO2: 99%   Weight: 66.7 kg (147 lb)   Height: 161.3 cm (63.5)   PainSc: Five     Body mass index is 25.63 kg/m?Marland Kitchen     Pain Score: Five  Pain Loc: Back    Fatigue Scale: 4    Pain Addressed:  N/A    Patient Evaluated for a Clinical Trial: No treatment clinical trial available for this patient.     [name] Cooperative Oncology Group performance status is 0, Fully active, able to carry on all pre-disease performance without restriction.Marland Kitchen     Physical Exam  Vitals reviewed.   Constitutional:       Appearance: Normal appearance.   HENT:      Head: Normocephalic and atraumatic.      Mouth/Throat:      Mouth: Mucous membranes are moist.      Pharynx: Oropharynx is clear. No oropharyngeal exudate.   Cardiovascular:      Rate and Rhythm: Regular rhythm. Tachycardia present.      Heart sounds: Normal heart sounds.   Pulmonary:      Effort: Pulmonary effort is normal.      Breath sounds: Normal breath sounds.   Abdominal:      General: Abdomen is flat. Bowel sounds are normal. There is no distension.      Palpations: Abdomen is soft.   Musculoskeletal:         General: No swelling. Normal range of motion.   Skin:     General: Skin is warm and dry.      Coloration: Skin is not jaundiced.   Neurological:      General: No focal deficit present.      Mental Status: She is alert and oriented to person, place, and time.   Psychiatric:         Mood and Affect: Mood normal.               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 Started FOLFIRINOX on 05/20/19. Tolerated her first treatment relatively well.    Diarrhea: Had mild diarrhea that was responsive to using imodium. Continue to use as needed. Currently moving her bowels without difficulty.    Dizziness: Likely due to dehydration and position change. Continue to make slow position changes. She is only drinking about 40 ounces of fluids daily. I have discussed the importance  of drinking at least 64 ounces of fluids, more if she is having diarrhea. I am going to give her a liter of fluids in clinic today. Her cold neuropathy is resolved so she can start drinking colder fluids as tolerated.    CINV: Mild nausea. Has zofran to use as needed.    Poor appetite: Due to taste alterations from chemotherapy. Weight down about six pounds from last week. Recommend that she continue to force herself to eat small, frequent meals. Also, I encouraged her to continue to drink protein drinks as well.     Back pain: this has been ongoing and predates chemotherapy. She has been getting adjustments from her son who is a Land. I suggested that she try some ibuprofen to see if this helps more than tylenol. She can also use heat and massage as well. We discussed that narcotics would be the next step and she is not particularly interested in narcotics at this time.    We discussed that blood counts are likely at their lowest at this time. She should call any time for temperature > 100.4    RTC: One week for MD visit, labs and treatment. Call sooner for questions or concerns.

## 2019-06-03 ENCOUNTER — Encounter: Admit: 2019-06-03 | Discharge: 2019-06-03 | Payer: MEDICARE

## 2019-06-03 DIAGNOSIS — C259 Malignant neoplasm of pancreas, unspecified: Secondary | ICD-10-CM

## 2019-06-03 DIAGNOSIS — G20 Parkinson's disease: Secondary | ICD-10-CM

## 2019-06-03 DIAGNOSIS — M549 Dorsalgia, unspecified: Secondary | ICD-10-CM

## 2019-06-03 DIAGNOSIS — L658 Other specified nonscarring hair loss: Secondary | ICD-10-CM

## 2019-06-03 LAB — CA19.9: Lab: 316 U/mL — ABNORMAL HIGH (ref <35)

## 2019-06-03 MED ORDER — HEPARIN, PORCINE (PF) 100 UNIT/ML IV SYRG
500 [IU] | Freq: Once | 0 refills | Status: DC
Start: 2019-06-03 — End: 2019-06-08

## 2019-06-03 MED ORDER — FLUOROURACIL IV AMB PUMP
2160 mg/m2 | Freq: Once | INTRAVENOUS | 0 refills | Status: CP
Start: 2019-06-03 — End: ?
  Administered 2019-06-03 (×3): 3845 mg via INTRAVENOUS

## 2019-06-03 MED ORDER — PALONOSETRON 0.25 MG/5 ML IV SOLN
.25 mg | Freq: Once | INTRAVENOUS | 0 refills | Status: CP
Start: 2019-06-03 — End: ?
  Administered 2019-06-03: 16:00:00 0.25 mg via INTRAVENOUS

## 2019-06-03 MED ORDER — ATROPINE 0.4 MG/ML IJ SOLN
0.4 mg | INTRAVENOUS | 0 refills | Status: DC | PRN
Start: 2019-06-03 — End: 2019-06-08
  Administered 2019-06-03: 18:00:00 0.4 mg via INTRAVENOUS

## 2019-06-03 MED ORDER — DEXAMETHASONE 6 MG PO TAB
12 mg | Freq: Once | ORAL | 0 refills | Status: CP
Start: 2019-06-03 — End: ?
  Administered 2019-06-03: 16:00:00 12 mg via ORAL

## 2019-06-03 MED ORDER — OXALIPLATIN IVPB
76.5 mg/m2 | Freq: Once | INTRAVENOUS | 0 refills | Status: CP
Start: 2019-06-03 — End: ?
  Administered 2019-06-03 (×3): 136.15 mg via INTRAVENOUS

## 2019-06-03 MED ORDER — IRINOTECAN IVPB
135 mg/m2 | Freq: Once | INTRAVENOUS | 0 refills | Status: CP
Start: 2019-06-03 — End: ?
  Administered 2019-06-03 (×2): 240.4 mg via INTRAVENOUS

## 2019-06-03 MED ORDER — LEUCOVORIN IVPB
360 mg/m2 | Freq: Once | INTRAVENOUS | 0 refills | Status: CP
Start: 2019-06-03 — End: ?
  Administered 2019-06-03 (×2): 640.8 mg via INTRAVENOUS

## 2019-06-03 NOTE — Progress Notes
Pt in tx for port access/labs and scheduled chemotherapy. Port accessed without difficulties. Labs drawn per order.   Lab results reviewed, pt seen by  today, ok to treat. Pt denies any concerns or questions at this time. Voiced understanding of the treatment regimen.     Premeds given per plan.     CHEMO NOTE  Verified chemo consent signed and in chart.    Verified initiate chemo order in O2    Blood return positive via: Port (single)    BSA and dose double checked (agree with orders as written) with:Yes    Labs/applicable tests checked: cbc, cmp    Chemo regime: FOLFIRINOX C2D1    Rate verified and armband double checkwith second RN:  See [facility]    Patient education offered and stated understanding. Denies questions at this time.     Pt tolerated the infusion well, Port flushed and ambulatory pump with 5 FU connected to pt's port to run over 46 hours. Pt left clinic in good condition.

## 2019-06-03 NOTE — Progress Notes
Name: Cassandra Wallace          MRN: [id number]      DOB: Apr 26, 1945      AGE: 74 y.o.   DATE OF SERVICE: 06/03/2019    Subjective:             Reason for Visit:  Follow Up      Cassandra Wallace is a 74 y.o. female.     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    Cassandra Wallace is being evaluated for abdominal pain and constipation and unintentional weight loss.  Unfortunately a CT of the abdomen and pelvis done in [place] showed pancreatic lesions as well as 3 right hepatic lesions.  An MRI saw the same lesions.  An E[facility] performed on May 07, 2019 at [facility] unfortunately found to abnormal lesions in the left lobe of the liver and a pancreatic mass in the neck the pancreas was invading the superior mesenteric artery.  Biopsies of both came back consistent with adenocarcinoma.  A CA 19-9 came back as [name] which is elevated.  Her chemistries were normal.  After long discussion the patient began FOLFIRINOX chemotherapy growth factor support on about May 20, 2019.    Cassandra Wallace is tolerating her FOLFIRINOX very well.  She has not had any unusual nausea vomiting diarrhea constipation mucositis issues.  Her back pain is improving her appetite may be slightly better.  Her hair is thinning.  We talked about a cranial prosthesis.  We reviewed her blood counts.    Cassandra Wallace is a very supportive family including her husband Cassandra Wallace and her son Trey Paula, who is a Land here in town.  I believe there are other children in the family.    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  He mostly worked in [place].  His name is Czechoslovakian/Bohemian.  We talked about ethnic foods.  Cassandra Wallace on the other hand is originally [place] and her ethnicity.  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    Cassandra Wallace and Verdi plan to move here from [place] and breast and will probably be several miles from the office.    In addition to the history of metastatic pancreatic cancer diagnosis the patient also has a history of Parkinson's though she has been very stable.    I am very pleased with how the patient is tolerating her chemotherapy thus far.  We explained that would like to continue the same.  She is willing to do so.  She will continue being cautious.  She is fully vaccinated.  She will see the nurse or myself in 2 in 4 weeks for additional chemo.           Review of Systems   Constitutional: Positive for appetite change. Negative for chills and fever.   HENT: Negative for nosebleeds.    Eyes: Negative for pain and visual disturbance.   Respiratory: Negative for cough and shortness of breath.    Cardiovascular: Negative for chest pain and leg swelling.   Gastrointestinal: Negative for abdominal pain.  Genitourinary: Negative for hematuria.   Musculoskeletal: Positive for back pain (But improved). Negative for joint swelling and neck pain.   Skin: Negative for rash.   Neurological: Negative for dizziness and weakness.   Hematological: Negative for adenopathy.   Psychiatric/Behavioral: Negative for confusion.   All other systems reviewed and are negative.        Objective:         ? carbidopa/levodopa (SINEMET) 25/100 mg tablet Take 1 tablet by mouth three times daily.   ? lidocaine/prilocaine (EMLA) 2.5/2.5 % topical cream Apply a thin film over port one hour prior to treatment. Do not rub in. Cover with plastic wrap after application   ? loperamide (IMODIUM A-D) 2 mg capsule Take 2 capsules by mouth after first loose/frequent bowel movement, then 1 capsule every 2 hours (2 capsules every 4 hours at night) until 12 hours have passed without a bowel movement.   ? ondansetron (ZOFRAN) 8 mg tablet Take one tablet by mouth every 8 hours as needed for Nausea.     Vitals:    06/03/19 1007   BP: 113/62   BP Source: Arm, Left Upper   Patient Position: Sitting   Pulse: 97   Resp: 16   Temp: 36.6 ?C (97.9 ?F)   SpO2: 98%   Weight: 67.6 kg (149 lb)   Height: 161.3 cm (63.5)   PainSc: Zero     Body mass index is 25.98 kg/m?Marland Kitchen     Medical History:   Diagnosis Date   ? Back pain    ? Pancreatic cancer (HCC)     mets to liver   ? Parkinson's disease (HCC) 2016     Surgical History:   Procedure Laterality Date   ? CATARACT REMOVAL Bilateral 2020   ? ESOPHAGOGASTRODUODENOSCOPY WITH TRANSENDOSCOPIC ULTRASOUND GUIDED FINE NEEDLE ASPIRATION/ BIOPSY N/A 05/07/2019    Performed by Comer Locket, MD at [facility] ENDO      Social History     Tobacco Use   ? Smoking status: Never Smoker   ? Smokeless tobacco: Never Used   Substance Use Topics   ? Alcohol use: Not Currently   ? Drug use: Never     Family History   Problem Relation Age of Onset   ? Cancer-Uterine Mother    ? Arthritis-rheumatoid Father          Pain Score: Zero            Pain Addressed:  Patient declines intervention    Patient Evaluated for a Clinical Trial: No treatment clinical trial available for this patient.     [name] Cooperative Oncology Group performance status is 1, Restricted in physically strenuous activity but ambulatory and able to carry out work of a light or sedentary nature, e.g., light house work, office work.     Physical Exam  Vitals reviewed.   Constitutional:       General: She is not in acute distress.     Appearance: Normal appearance. She is well-developed. She is not ill-appearing.   HENT:      Head: Normocephalic and atraumatic.   Eyes:      Conjunctiva/sclera: Conjunctivae normal.   Neck:      Vascular: No JVD.      Trachea: No tracheal deviation.   Cardiovascular:      Rate and Rhythm: Normal rate and regular rhythm.      Heart sounds: No friction rub. No gallop. Pulmonary:      Effort: Pulmonary effort is  normal. No respiratory distress.      Breath sounds: Normal breath sounds. No stridor. No wheezing or rales.   Abdominal:      General: There is no distension.      Palpations: Abdomen is soft. There is no mass.      Tenderness: There is no abdominal tenderness.   Musculoskeletal:         General: Normal range of motion.      Cervical back: Normal range of motion.   Lymphadenopathy:      Head:      Right side of head: No submental or submandibular adenopathy.      Left side of head: No submental or submandibular adenopathy.      Cervical: No cervical adenopathy.      Right cervical: No superficial or posterior cervical adenopathy.     Left cervical: No superficial or posterior cervical adenopathy.      Upper Body:      Right upper body: No supraclavicular or epitrochlear adenopathy.      Left upper body: No supraclavicular or epitrochlear adenopathy.   Skin:     General: Skin is warm and dry.      Findings: No rash.   Neurological:      Mental Status: She is alert and oriented to person, place, and time.      Cranial Nerves: No cranial nerve deficit.   Psychiatric:         Mood and Affect: Mood normal.         Behavior: Behavior normal.         Thought Content: Thought content normal.         Judgment: Judgment normal.               Assessment and Plan:  Patient Active Problem List    Diagnosis Date Noted   ? Pancreatic cancer metastasized to liver [facility]) 05/07/2019     Priority: Medium     04/13/2019 [facility] medical imaging [facility] CT abdomen pelvis with contrast: Abdominal pain constipation unintentional weight loss.  The head of the pancreas is 3 hypodense lesions.  The lesion towards the right lateral side measures 1.8 cm.  Anterior lesion the mild/mid head measures 11.4 cm.  There are 3 right hepatic lesions that are suspicious.  Impression: Multifocal pancreas lesion suspicious for tumor such as adenocarcinoma.  Could also be focal inflammation from subacute or chronic pancreatitis.  04/20/2019 [place] medical imaging [facility] MRI abdomen without and with contrast.  Indeterminate lesions in the pancreatic bed and proximal body.  Pancreatic neoplasm cannot be excluded.  This is compared to CT/12/2019.  Several right and left hepatic lesions are present.  The 2 largest are adjacent to 1 another.  Measuring 2.1 x 1.8 cm.  Concerning for hepatic metastatic lesions.  Heterogeneous process pancreatic head and proximal body the whole area measures approximately 5.2 x 3.5 cm.  It appears to be least some encasement of the associated narrowing of the superior mesenteric vein  04/23/2019 [facility] clinic note.  Patient had Covid in November 2020.  Recent imaging showed spots on pancreas.  History of Parkinson's weight 156 pounds  05/07/2019 [place] EUS report examination of the left lobe of the liver revealed 2 lesions measuring 1 cm each.  FNA x4 was performed upper limit result is positive for malignant cells.  Examination of pancreatic revealed a mass in the neck the pancreas with invasion to SMA.  Fine-needle biopsy was obtained and preliminary  is all is positive for malignant cells.  Await cytology results and await path results.  05/07/2019 CA 19-9 1782, H 12.3, platelets 120 low, W3.5 low, chemistries normal,  05/07/2019 [place] pathology/cytology liver US-FNA malignant cells present consistent with adenocarcinoma.  Pancreatic neck EUS FNA: Malignant cells present consistent with adenocarcinoma.  Mismatch repair proteins are ordered and will reported in an addendum.  Microsatellite instability test by PCR is also been ordered and will be reported separately.  Part A contain sufficient tissue for further testing.  05/11/2019 this is patient's first visit with [facility] oncology.  Husband Cassandra Wallace participated in person son Trey Paula who is a Land, by phone.  Discussed biopsy results and also stage and prognosis of stage IV pancreatic cancer.  Talked about treatment options including FOLFIRINOX with growth factor, Gemzar and Abraxane, single agent Gemzar, no therapy/palliative care.  Patient is very good performance status.  Would suggest slightly dose reduced FOLFIRINOX with growth factor.  Patient will think about it but is willing to set things in motion for PowerPort placement and chemotherapy and teaching etc.  They plan to move here from [facility] in the near future.  05/14/2019 [place] CT chest multiple tiny bilateral pulmonary nodules suspicious for metastatic disease the likely 2 small for definitive PET/CT characterization or biopsy.  A follow-up chest CT in 2-3 months is suggested to assess for stability.  No thoracic adenopathy.  05/19/19: FOLFIRINOX education  05/20/19 Started modified FOLFIRINOX  05/26/19: Toxicity check: Tolerated first cycle relatively well. Minimal nausea and diarrhea. 1 liter of fluids in clinic today. Having back pain. Recommended trying some ibuprofen, heat and massage, reassess next week with Dr. Madison Hickman.  06/03/2019 cycle cycle 1 day 15 FOLFIRINOX appetite improving less back pain all very encouraging tumor marker pending lab work looks good.  Proceed with cycle 1 day 15 see nurse in 2 weeks physician in 4 weeks imaging after 2 months    Active therapy FOLFIRINOX with growth factor    Con[facility] Oms       ? Dehydration 05/26/2019   ? 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 CINV (chemotherapy-induced nausea and vomiting) 05/26/2019   ? Dizziness 05/26/2019   ? Diarrhea 05/26/2019   ? Parkinson's disease (HCC) 05/11/2019     This note was created with partial dictation using a dragon dictation system, please notify us if you notice any errors of omissions or content.

## 2019-06-05 ENCOUNTER — Encounter: Admit: 2019-06-05 | Discharge: 2019-06-05 | Payer: MEDICARE

## 2019-06-05 DIAGNOSIS — C259 Malignant neoplasm of pancreas, unspecified: Secondary | ICD-10-CM

## 2019-06-05 MED ORDER — PEGFILGRASTIM-CBQV 6 MG/0.6 ML SC SYRG
6 mg | Freq: Once | SUBCUTANEOUS | 0 refills | Status: CP
Start: 2019-06-05 — End: ?
  Administered 2019-06-05: 19:00:00 6 mg via SUBCUTANEOUS

## 2019-06-05 MED ORDER — HEPARIN, PORCINE (PF) 100 UNIT/ML IV SYRG
500 [IU] | Freq: Once | 0 refills | Status: CP
Start: 2019-06-05 — End: ?

## 2019-06-05 NOTE — Progress Notes
Pt in treatment for CADD unhook.  Port and dressing assessed, CADD pump stopped, port flushed per protocol, blood return noted.  Pt de-accessed, skin at port site clean, dry, and intact.    Udenyca sq injection given on pt's abd tissue, pt tolerated well.     C/o n/v/d, taking home meds zofran, immodium, states that she has been taking enough oral fluids.    Pt denies further needs, pt dismissed ambulatory to home

## 2019-06-10 ENCOUNTER — Encounter: Admit: 2019-06-10 | Discharge: 2019-06-10 | Payer: MEDICARE

## 2019-06-10 DIAGNOSIS — K8689 Other specified diseases of pancreas: Secondary | ICD-10-CM

## 2019-06-10 DIAGNOSIS — K769 Liver disease, unspecified: Secondary | ICD-10-CM

## 2019-06-10 MED ORDER — IRINOTECAN IVPB
135 mg/m2 | Freq: Once | INTRAVENOUS | 0 refills | Status: CN
Start: 2019-06-10 — End: ?

## 2019-06-10 MED ORDER — OXALIPLATIN IVPB
76.5 mg/m2 | Freq: Once | INTRAVENOUS | 0 refills | Status: CN
Start: 2019-06-10 — End: ?

## 2019-06-10 MED ORDER — LEUCOVORIN IVPB
360 mg/m2 | Freq: Once | INTRAVENOUS | 0 refills | Status: CN
Start: 2019-06-10 — End: ?

## 2019-06-10 MED ORDER — FLUOROURACIL IV AMB PUMP
2160 mg/m2 | Freq: Once | INTRAVENOUS | 0 refills | Status: CN
Start: 2019-06-10 — End: ?

## 2019-06-10 MED ORDER — FLUOROURACIL IV AMB PUMP
2160 mg/m2 | Freq: Once | INTRAVENOUS | 0 refills
Start: 2019-06-10 — End: ?

## 2019-06-10 MED ORDER — ATROPINE 0.4 MG/ML IJ SOLN
0.4 mg | INTRAVENOUS | 0 refills | PRN
Start: 2019-06-10 — End: ?

## 2019-06-10 MED ORDER — PALONOSETRON 0.25 MG/5 ML IV SOLN
.25 mg | Freq: Once | INTRAVENOUS | 0 refills | Status: CN
Start: 2019-06-10 — End: ?

## 2019-06-10 MED ORDER — DEXAMETHASONE 6 MG PO TAB
12 mg | Freq: Once | ORAL | 0 refills | Status: CN
Start: 2019-06-10 — End: ?

## 2019-06-10 MED ORDER — PALONOSETRON 0.25 MG/5 ML IV SOLN
.25 mg | Freq: Once | INTRAVENOUS | 0 refills
Start: 2019-06-10 — End: ?

## 2019-06-10 MED ORDER — ATROPINE 0.4 MG/ML IJ SOLN
0.4 mg | INTRAVENOUS | 0 refills | Status: CN | PRN
Start: 2019-06-10 — End: ?

## 2019-06-10 MED ORDER — IRINOTECAN IVPB
135 mg/m2 | Freq: Once | INTRAVENOUS | 0 refills
Start: 2019-06-10 — End: ?

## 2019-06-10 MED ORDER — DEXAMETHASONE 6 MG PO TAB
12 mg | Freq: Once | ORAL | 0 refills
Start: 2019-06-10 — End: ?

## 2019-06-15 ENCOUNTER — Encounter: Admit: 2019-06-15 | Discharge: 2019-06-15 | Payer: MEDICARE

## 2019-06-16 ENCOUNTER — Encounter: Admit: 2019-06-16 | Discharge: 2019-06-16 | Payer: MEDICARE

## 2019-06-16 DIAGNOSIS — C259 Malignant neoplasm of pancreas, unspecified: Secondary | ICD-10-CM

## 2019-06-16 LAB — CA19.9: Lab: 341 U/mL — ABNORMAL HIGH (ref <35)

## 2019-06-16 MED ORDER — ATROPINE 0.4 MG/ML IJ SOLN
0.4 mg | INTRAVENOUS | 0 refills | Status: DC | PRN
Start: 2019-06-16 — End: 2019-06-21
  Administered 2019-06-16: 15:00:00 0.4 mg via INTRAVENOUS

## 2019-06-16 MED ORDER — PALONOSETRON 0.25 MG/5 ML IV SOLN
.25 mg | Freq: Once | INTRAVENOUS | 0 refills | Status: CP
Start: 2019-06-16 — End: ?
  Administered 2019-06-16: 15:00:00 0.25 mg via INTRAVENOUS

## 2019-06-16 MED ORDER — FLUOROURACIL IV AMB PUMP
2160 mg/m2 | Freq: Once | INTRAVENOUS | 0 refills | Status: CP
Start: 2019-06-16 — End: ?
  Administered 2019-06-16: 19:00:00 3845 mg via INTRAVENOUS

## 2019-06-16 MED ORDER — DEXAMETHASONE 6 MG PO TAB
12 mg | Freq: Once | ORAL | 0 refills | Status: CP
Start: 2019-06-16 — End: ?
  Administered 2019-06-16: 15:00:00 12 mg via ORAL

## 2019-06-16 MED ORDER — IRINOTECAN IVPB
135 mg/m2 | Freq: Once | INTRAVENOUS | 0 refills | Status: CP
Start: 2019-06-16 — End: ?
  Administered 2019-06-16 (×2): 240.4 mg via INTRAVENOUS

## 2019-06-16 MED ORDER — LEUCOVORIN IVPB
360 mg/m2 | Freq: Once | INTRAVENOUS | 0 refills | Status: CP
Start: 2019-06-16 — End: ?
  Administered 2019-06-16: 17:00:00 640.8 mg via INTRAVENOUS

## 2019-06-16 MED ORDER — OXALIPLATIN IVPB
76.5 mg/m2 | Freq: Once | INTRAVENOUS | 0 refills | Status: CP
Start: 2019-06-16 — End: ?
  Administered 2019-06-16 (×3): 136.15 mg via INTRAVENOUS

## 2019-06-16 NOTE — Progress Notes
Cycle 3 Day 1 Folfirinox.  Port accessed, labs collected.  Patient to follow up with Vernona Rieger, NP.   OK to treat, labs OK to treat.  Tolerated infusion well.   Discharged in stable condition with ambulatory pump infusing.     CHEMO NOTE  Verified chemo consent signed and in chart.    Verified initiate chemo order in O2    Blood return positive via: Port (Single)    BSA and dose double checked (agree with orders as written) with: yes     Labs/applicable tests checked: CBC and Comprehensive Metabolic Panel (CMP)    Chemo regime: Drug/cycle/day Folfirinox C3 D1     Rate verified and armband double checkwith second RN: yes see MAR    Patient education offered and stated understanding. Denies questions at this time.

## 2019-06-16 NOTE — Patient Instructions
Call Immediately to report the following:  Uncontrolled nausea and/or vomiting, uncontrolled pain, or unusual bleeding.  Temperature of 100.4 F or greater and/or any sign/symptom of infection (redness, warmth, tenderness)  Painful mouth or difficulty swallowing  Red, cracked, or painful hands and/or feet  Diarrhea   Swelling of arms or legs  Rash    Important Phone Numbers:  [facility] Main Number (answered 24 hours a day) [phone]  Cancer Center Scheduling (appointments) [phone] OR [phone]  [facility] (for nutritional supplements) [phone]        Port Maintenance - If you have a port, it should be flushed every 6-8 weeks when not in use.  Please check with your MD, nurse, or the scheduler.

## 2019-06-16 NOTE — Progress Notes
Name: Cassandra Wallace          MRN: [id number]      DOB: 1945-01-04      AGE: 74 y.o.   DATE OF SERVICE: 06/16/2019    Subjective:             Reason for Visit:  Follow Up      Cassandra Wallace is a 74 y.o. female.     Cancer Staging  Pancreatic cancer metastasized to liver [facility])  Staging form: Exocrine Pancreas, AJCC 8th Edition  - Clinical stage from 05/09/2019: Stage IV (cT4, cN0, pM1) - Signed by Lady Deutscher, MD on 05/09/2019      History of Present Illness  Cassandra Wallace is here today in FU of her metastatic pancreatic cancer and C3 FOLFIRINOX chemotherapy.  She has a history of abdominal pain, constipation and unintentional weight loss. ?Unfortunately a CT  abdomen and pelvis done in [place] showed pancreatic lesions as well as 3 right hepatic lesions. ?An MRI saw the same lesions. ?An EUS performed on May 07, 2019 at [place] found to abnormal lesions in the left lobe of the liver and a pancreatic mass in the neck the pancreas that was invading the superior mesenteric artery. Biopsies of both came back consistent with adenocarcinoma. A CA 19-9 came back as Cassandra Wallace which is elevated. ?  ?  Started FOLFIRINOX  on 05/20/19.  ?  Interval Events:   No new c/o.  Tolerating chemotherapy very well.    Husband is in the hospital with infection in his knee joint.         Review of Systems   Constitutional: Positive for appetite change (IMPROVING) and fatigue (IMPROVING).   HENT: Negative.  Negative for mouth sores.    Respiratory: Negative.    Cardiovascular: Negative.    Gastrointestinal: Positive for constipation (MILD). Negative for abdominal pain, diarrhea and nausea.   Genitourinary: Negative.    Musculoskeletal: Negative.    Skin: Negative.  Negative for rash.   Neurological: Negative.  Negative for numbness.   Hematological: Negative.    Psychiatric/Behavioral: Negative.          Objective:         ? carbidopa/levodopa (SINEMET) 25/100 mg tablet Take 1 tablet by mouth three times daily.   ? lidocaine/prilocaine (EMLA) 2.5/2.5 % topical cream Apply a thin film over port one hour prior to treatment. Do not rub in. Cover with plastic wrap after application   ? loperamide (IMODIUM A-D) 2 mg capsule Take 2 capsules by mouth after first loose/frequent bowel movement, then 1 capsule every 2 hours (2 capsules every 4 hours at night) until 12 hours have passed without a bowel movement.   ? ondansetron (ZOFRAN) 8 mg tablet Take one tablet by mouth every 8 hours as needed for Nausea.     Vitals:    06/16/19 0857 06/16/19 0858 06/16/19 0859   BP: 136/76     BP Source: Arm, Right Upper     Patient Position: Sitting     Pulse: 109     Resp: 16     Temp: 36.9 ?C (98.5 ?F)     TempSrc: Oral Oral    SpO2: 98%     Weight: 66.3 kg (146 lb 3.2 oz) 66.3 kg (146 lb 3.2 oz)    Height: 161.3 cm (63.5) 161.3 cm (63.5)    PainSc: Zero  Zero     Body mass index is 25.49 kg/m?Marland Kitchen     Pain Score:  Zero       Fatigue Scale: 0-None    Pain Addressed:  N/A    Patient Evaluated for a Clinical Trial: No treatment clinical trial available for this patient.     [name] Cooperative Oncology Group performance status is 0, Fully active, able to carry on all pre-disease performance without restriction.Marland Kitchen     Physical Exam  Vitals reviewed.   Constitutional:       Appearance: Normal appearance.   HENT:      Head: Normocephalic.   Eyes:      Extraocular Movements: Extraocular movements intact.   Cardiovascular:      Rate and Rhythm: Normal rate.   Pulmonary:      Effort: Pulmonary effort is normal.   Musculoskeletal:         General: Normal range of motion.      Cervical back: Normal range of motion.   Skin:     General: Skin is warm and dry.   Neurological:      General: No focal deficit present.      Mental Status: She is alert and oriented to person, place, and time.   Psychiatric:         Mood and Affect: Mood normal.         Behavior: Behavior normal.         Thought Content: Thought content normal.          Results for orders placed or performed during the hospital encounter of 06/16/19 (from the past 336 hour(s))   CBC AND DIFF   Result Value Ref Range    White Blood Cells 8.0 4.5 - 11.0 K/UL    RBC 3.97 (L) 4.0 - 5.0 M/UL    Hemoglobin 11.9 (L) 12.0 - 15.0 GM/DL    Hematocrit 16.1 36 - 45 %    MCV 92.4 80 - 100 FL    MCH 30.0 26 - 34 PG    MCHC 32.5 32.0 - 36.0 G/DL    RDW 09.6 (H) 11 - 15 %    Platelet Count 183 150 - 400 K/UL    MPV 8.8 7 - 11 FL    Neutrophils 68 41 - 77 %    Lymphocytes 16 (L) 24 - 44 %    Monocytes 14 (H) 4 - 12 %    Eosinophils 1 0 - 5 %    Basophils 1 0 - 2 %    Absolute Neutrophil Count 5.50 1.8 - 7.0 K/UL    Absolute Lymph Count 1.20 1.0 - 4.8 K/UL    Absolute Monocyte Count 1.10 (H) 0 - 0.80 K/UL    Absolute Eosinophil Count 0.10 0 - 0.45 K/UL    Absolute Basophil Count 0.10 0 - 0.20 K/UL    ANISO PRESENT     Ovalocyte PRESENT     Platelet Estimate NORMAL           Assessment and Plan:       Patient Active Problem List    Diagnosis Date Noted   ? Dehydration 05/26/2019   ? 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 CINV (chemotherapy-induced nausea and vomiting) 05/26/2019   ? Dizziness 05/26/2019   ? Diarrhea 05/26/2019   ? Parkinson's disease (HCC) 05/11/2019   ? Pancreatic cancer metastasized to liver [facility]) 05/07/2019     04/13/2019 [facility] medical imaging [place] CT abdomen pelvis with contrast: Abdominal pain constipation unintentional weight loss.  The head of the pancreas is 3 hypodense  lesions.  The lesion towards the right lateral side measures 1.8 cm.  Anterior lesion the mild/mid head measures 11.4 cm.  There are 3 right hepatic lesions that are suspicious.  Impression: Multifocal pancreas lesion suspicious for tumor such as adenocarcinoma.  Could also be focal inflammation from subacute or chronic pancreatitis.  04/20/2019 [facility] medical imaging [facility] MRI abdomen without and with contrast.  Indeterminate lesions in the pancreatic bed and proximal body.  Pancreatic neoplasm cannot be excluded.  This is compared to CT/12/2019.  Several right and left hepatic lesions are present.  The 2 largest are adjacent to 1 another.  Measuring 2.1 x 1.8 cm.  Concerning for hepatic metastatic lesions.  Heterogeneous process pancreatic head and proximal body the whole area measures approximately 5.2 x 3.5 cm.  It appears to be least some encasement of the associated narrowing of the superior mesenteric vein  04/23/2019 [facility] clinic note.  Patient had Covid in November 2020.  Recent imaging showed spots on pancreas.  History of Parkinson's weight 156 pounds  05/07/2019 [place] EUS report examination of the left lobe of the liver revealed 2 lesions measuring 1 cm each.  FNA x4 was performed upper limit result is positive for malignant cells.  Examination of pancreatic revealed a mass in the neck the pancreas with invasion to SMA.  Fine-needle biopsy was obtained and preliminary is all is positive for malignant cells.  Await cytology results and await path results.  05/07/2019 CA 19-9 1782, H 12.3, platelets 120 low, W3.5 low, chemistries normal,  05/07/2019 [place] pathology/cytology liver US-FNA malignant cells present consistent with adenocarcinoma.  Pancreatic neck EUS FNA: Malignant cells present consistent with adenocarcinoma.  Mismatch repair proteins are ordered and will reported in an addendum.  Microsatellite instability test by PCR is also been ordered and will be reported separately.  Part A contain sufficient tissue for further testing.  05/11/2019 this is patient's first visit with [facility] oncology.  Husband Ron participated in person son Trey Paula who is a Land, by phone.  Discussed biopsy results and also stage and prognosis of stage IV pancreatic cancer.  Talked about treatment options including FOLFIRINOX with growth factor, Gemzar and Abraxane, single agent Gemzar, no therapy/palliative care.  Patient is very good performance status.  Would suggest slightly dose reduced FOLFIRINOX with growth factor.  Patient will think about it but is willing to set things in motion for PowerPort placement and chemotherapy and teaching etc.  They plan to move here from [facility] in the near future.  05/14/2019 [place] CT chest multiple tiny bilateral pulmonary nodules suspicious for metastatic disease the likely 2 small for definitive PET/CT characterization or biopsy.  A follow-up chest CT in 2-3 months is suggested to assess for stability.  No thoracic adenopathy.  05/19/19: FOLFIRINOX education  05/20/19 Started modified FOLFIRINOX  05/26/19: Toxicity check: Tolerated first cycle relatively well. Minimal nausea and diarrhea. 1 liter of fluids in clinic today. Having back pain. Recommended trying some ibuprofen, heat and massage, reassess next week with Dr. Madison Hickman.  06/03/2019 cycle cycle 1 day 15 FOLFIRINOX appetite improving less back pain all very encouraging tumor marker pending lab work looks good.  Proceed with cycle 1 day 15 see nurse in 2 weeks physician in 4 weeks imaging after 2 months  06/16/19  C3 FOLFIRINOX.  Tolerating therapy very well.  Tumor marker continues to decrease.  FU with Dr. Madison Hickman in 2 weeks.      Active therapy FOLFIRINOX with growth factor  Conception Oms

## 2019-06-18 ENCOUNTER — Encounter: Admit: 2019-06-18 | Discharge: 2019-06-18 | Payer: MEDICARE

## 2019-06-18 DIAGNOSIS — G20 Parkinson's disease: Secondary | ICD-10-CM

## 2019-06-18 DIAGNOSIS — C259 Malignant neoplasm of pancreas, unspecified: Secondary | ICD-10-CM

## 2019-06-18 DIAGNOSIS — M549 Dorsalgia, unspecified: Secondary | ICD-10-CM

## 2019-06-18 MED ORDER — PEGFILGRASTIM-CBQV 6 MG/0.6 ML SC SYRG
6 mg | Freq: Once | SUBCUTANEOUS | 0 refills | Status: CP
Start: 2019-06-18 — End: ?
  Administered 2019-06-18: 19:00:00 6 mg via SUBCUTANEOUS

## 2019-06-18 MED ORDER — HEPARIN, PORCINE (PF) 100 UNIT/ML IV SYRG
500 [IU] | Freq: Once | 0 refills | Status: CP
Start: 2019-06-18 — End: ?

## 2019-06-18 NOTE — Progress Notes
Cycle 3 Day 3 Udenyca.   Pump DC'd.  Port flushed, blood returned.  Port de-accessed, heparinized.  Injection given per plan.   Tolerated well.   Patient discharged in stable condition.

## 2019-07-07 ENCOUNTER — Encounter: Admit: 2019-07-07 | Discharge: 2019-07-07 | Payer: MEDICARE

## 2019-07-08 ENCOUNTER — Encounter: Admit: 2019-07-08 | Discharge: 2019-07-08 | Payer: MEDICARE

## 2019-07-08 DIAGNOSIS — M549 Dorsalgia, unspecified: Secondary | ICD-10-CM

## 2019-07-08 DIAGNOSIS — C259 Malignant neoplasm of pancreas, unspecified: Secondary | ICD-10-CM

## 2019-07-08 DIAGNOSIS — G20 Parkinson's disease: Secondary | ICD-10-CM

## 2019-07-08 LAB — CA19.9: Lab: 330 U/mL — ABNORMAL HIGH (ref <35)

## 2019-07-08 MED ORDER — IRINOTECAN IVPB
135 mg/m2 | Freq: Once | INTRAVENOUS | 0 refills | Status: CP
Start: 2019-07-08 — End: ?
  Administered 2019-07-08 (×2): 240.4 mg via INTRAVENOUS

## 2019-07-08 MED ORDER — ATROPINE 0.4 MG/ML IJ SOLN
0.4 mg | INTRAVENOUS | 0 refills | Status: DC | PRN
Start: 2019-07-08 — End: 2019-07-13
  Administered 2019-07-08: 17:00:00 0.4 mg via INTRAVENOUS

## 2019-07-08 MED ORDER — FLUOROURACIL IV AMB PUMP
2160 mg/m2 | Freq: Once | INTRAVENOUS | 0 refills | Status: CP
Start: 2019-07-08 — End: ?
  Administered 2019-07-08 (×2): 3845 mg via INTRAVENOUS

## 2019-07-08 MED ORDER — OXALIPLATIN IVPB
76.5 mg/m2 | Freq: Once | INTRAVENOUS | 0 refills | Status: CP
Start: 2019-07-08 — End: ?
  Administered 2019-07-08: 15:00:00 136.15 mg via INTRAVENOUS

## 2019-07-08 MED ORDER — PALONOSETRON 0.25 MG/5 ML IV SOLN
.25 mg | Freq: Once | INTRAVENOUS | 0 refills | Status: CP
Start: 2019-07-08 — End: ?
  Administered 2019-07-08: 15:00:00 0.25 mg via INTRAVENOUS

## 2019-07-08 MED ORDER — DEXAMETHASONE 6 MG PO TAB
12 mg | Freq: Once | ORAL | 0 refills | Status: CP
Start: 2019-07-08 — End: ?
  Administered 2019-07-08: 15:00:00 12 mg via ORAL

## 2019-07-08 NOTE — Progress Notes
CHEMO NOTE  Verified chemo consent signed and in chart.    Verified initiate chemo order in O2    Blood return positive via: Port (Single)    BSA and dose double checked (agree with orders as written) with: yes see MAR    Labs/applicable tests checked: CBC and Comprehensive Metabolic Panel (CMP)    Chemo regime: Drug/cycle/day cycle 4 Oxaliplatin, Irinotecan, 5FU CI    Rate verified and armband double checkwith second RN: yes    Patient education offered and stated understanding. Denies questions at this time.

## 2019-07-08 NOTE — Progress Notes
Name: [name]          MRN: [id number]      DOB: 06-30-1945      AGE: 74 y.o.   DATE OF SERVICE: 07/08/2019    Subjective:             Reason for Visit:  No chief complaint on file.      SAHNIYA Wallace is a 74 y.o. female.     Cancer Staging  Pancreatic cancer metastasized to liver [facility])  Staging form: Exocrine Pancreas, AJCC 8th Edition  - Clinical stage from 05/09/2019: Stage IV (cT4, cN0, pM1) - Signed by Lady Deutscher, MD on 05/09/2019      History of Present Illness  Cassandra Wallace is here today in FU of her metastatic pancreatic cancer and C4 FOLFIRINOX chemotherapy.  She has a history of abdominal pain, constipation and unintentional weight loss. ?Unfortunately a CT ?abdomen and pelvis done in [place] showed pancreatic lesions as well as 3 right hepatic lesions. ?An MRI saw the same lesions. ?An EUS performed on May 07, 2019 at [place] found to abnormal lesions in the left lobe of the liver and a pancreatic mass in the neck the pancreas that was invading the superior mesenteric artery. Biopsies of both came back consistent with adenocarcinoma. A CA 19-9 came back as [name] which is elevated.??  ?  Started?FOLFIRINOX ?on 05/20/19.  ?  Interval Events:   Pt took an extra week to spend time with friends and family over the holiday weekend.  She had a great time.  No new sx or concerns.  Appetite has improved.         Review of Systems   Constitutional: Positive for appetite change (improved) and fatigue.   HENT: Negative.    Respiratory: Negative.    Cardiovascular: Negative.    Gastrointestinal: Negative.    Genitourinary: Negative.    Musculoskeletal: Negative.    Skin: Negative.    Neurological: Negative.  Negative for numbness.   Hematological: Negative.    Psychiatric/Behavioral: Negative.          Objective:         ? carbidopa/levodopa (SINEMET) 25/100 mg tablet Take 1 tablet by mouth three times daily.   ? lidocaine/prilocaine (EMLA) 2.5/2.5 % topical cream Apply a thin film over port one hour prior to treatment. Do not rub in. Cover with plastic wrap after application   ? loperamide (IMODIUM A-D) 2 mg capsule Take 2 capsules by mouth after first loose/frequent bowel movement, then 1 capsule every 2 hours (2 capsules every 4 hours at night) until 12 hours have passed without a bowel movement.   ? ondansetron (ZOFRAN) 8 mg tablet Take one tablet by mouth every 8 hours as needed for Nausea.     There were no vitals filed for this visit.  There is no height or weight on file to calculate BMI.                  Pain Addressed:  N/A    Patient Evaluated for a Clinical Trial: No treatment clinical trial available for this patient.     [name] Cooperative Oncology Group performance status is 0, Fully active, able to carry on all pre-disease performance without restriction.Marland Kitchen     Physical Exam  Vitals reviewed.   Constitutional:       Appearance: Normal appearance.   HENT:      Head: Normocephalic.   Eyes:      Extraocular Movements:  Extraocular movements intact.   Cardiovascular:      Rate and Rhythm: Normal rate.   Pulmonary:      Effort: Pulmonary effort is normal.   Musculoskeletal:         General: Normal range of motion.      Cervical back: Normal range of motion.   Skin:     General: Skin is warm and dry.   Neurological:      General: No focal deficit present.      Mental Status: She is alert and oriented to person, place, and time.   Psychiatric:         Mood and Affect: Mood normal.         Behavior: Behavior normal.         Thought Content: Thought content normal.          Results for orders placed or performed during the hospital encounter of 07/08/19 (from the past 336 hour(s))   CBC AND DIFF   Result Value Ref Range    White Blood Cells 9.0 4.5 - 11.0 K/UL    RBC 3.81 (L) 4.0 - 5.0 M/UL    Hemoglobin 11.7 (L) 12.0 - 15.0 GM/DL    Hematocrit 09.8 36 - 45 %    MCV 95.0 80 - 100 FL    MCH 30.8 26 - 34 PG    MCHC 32.4 32.0 - 36.0 G/DL    RDW 11.9 (H) 11 - 15 %    Platelet Count 205 150 - 400 K/UL    MPV 8.8 7 - 11 FL    Neutrophils 75 41 - 77 %    Lymphocytes 11 (L) 24 - 44 %    Monocytes 13 (H) 4 - 12 %    Eosinophils 0 0 - 5 %    Basophils 1 0 - 2 %    Absolute Neutrophil Count 6.80 1.8 - 7.0 K/UL    Absolute Lymph Count 1.00 1.0 - 4.8 K/UL    Absolute Monocyte Count 1.10 (H) 0 - 0.80 K/UL    Absolute Eosinophil Count 0.00 0 - 0.45 K/UL    Absolute Basophil Count 0.00 0 - 0.20 K/UL          Assessment and Plan:       Patient Active Problem List    Diagnosis Date Noted   ? Dehydration 05/26/2019   ? 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 CINV (chemotherapy-induced nausea and vomiting) 05/26/2019   ? Dizziness 05/26/2019   ? Diarrhea 05/26/2019   ? Parkinson's disease (HCC) 05/11/2019   ? Pancreatic cancer metastasized to liver [facility]) 05/07/2019     04/13/2019 [facility] medical imaging [facility] CT abdomen pelvis with contrast: Abdominal pain constipation unintentional weight loss.  The head of the pancreas is 3 hypodense lesions.  The lesion towards the right lateral side measures 1.8 cm.  Anterior lesion the mild/mid head measures 11.4 cm.  There are 3 right hepatic lesions that are suspicious.  Impression: Multifocal pancreas lesion suspicious for tumor such as adenocarcinoma.  Could also be focal inflammation from subacute or chronic pancreatitis.  04/20/2019 [facility] medical imaging [facility] MRI abdomen without and with contrast.  Indeterminate lesions in the pancreatic bed and proximal body.  Pancreatic neoplasm cannot be excluded.  This is compared to CT/12/2019.  Several right and left hepatic lesions are present.  The 2 largest are adjacent to 1 another.  Measuring 2.1 x 1.8 cm.  Concerning for hepatic metastatic  lesions.  Heterogeneous process pancreatic head and proximal body the whole area measures approximately 5.2 x 3.5 cm.  It appears to be least some encasement of the associated narrowing of the superior mesenteric vein  04/23/2019 [facility] clinic note.  Patient had Covid in November 2020.  Recent imaging showed spots on pancreas.  History of Parkinson's weight 156 pounds  05/07/2019 [facility] EUS report examination of the left lobe of the liver revealed 2 lesions measuring 1 cm each.  FNA x4 was performed upper limit result is positive for malignant cells.  Examination of pancreatic revealed a mass in the neck the pancreas with invasion to SMA.  Fine-needle biopsy was obtained and preliminary is all is positive for malignant cells.  Await cytology results and await path results.  05/07/2019 CA 19-9 1782, H 12.3, platelets 120 low, W3.5 low, chemistries normal,  05/07/2019 [place] pathology/cytology liver US-FNA malignant cells present consistent with adenocarcinoma.  Pancreatic neck EUS FNA: Malignant cells present consistent with adenocarcinoma.  Mismatch repair proteins are ordered and will reported in an addendum.  Microsatellite instability test by PCR is also been ordered and will be reported separately.  Part A contain sufficient tissue for further testing.  05/11/2019 this is patient's first visit with [facility] oncology.  Husband Ron participated in person son Trey Paula who is a [employer], by phone.  Discussed biopsy results and also stage and prognosis of stage IV pancreatic cancer.  Talked about treatment options including FOLFIRINOX with growth factor, Gemzar and Abraxane, single agent Gemzar, no therapy/palliative care.  Patient is very good performance status.  Would suggest slightly dose reduced FOLFIRINOX with growth factor.  Patient will think about it but is willing to set things in motion for PowerPort placement and chemotherapy and teaching etc.  They plan to move here from [facility] in the near future.  05/14/2019 [place] CT chest multiple tiny bilateral pulmonary nodules suspicious for metastatic disease the likely 2 small for definitive PET/CT characterization or biopsy.  A follow-up chest CT in 2-3 months is suggested to assess for stability.  No thoracic adenopathy.  05/19/19: FOLFIRINOX education  05/20/19 Started modified FOLFIRINOX  05/26/19: Toxicity check: Tolerated first cycle relatively well. Minimal nausea and diarrhea. 1 liter of fluids in clinic today. Having back pain. Recommended trying some ibuprofen, heat and massage, reassess next week with Dr. Madison Hickman.  06/03/2019 cycle cycle 1 day 15 FOLFIRINOX appetite improving less back pain all very encouraging tumor marker pending lab work looks good.  Proceed with cycle 1 day 15 see nurse in 2 weeks physician in 4 weeks imaging after 2 months  06/16/19  C3 FOLFIRINOX.  Tolerating therapy very well.  Tumor marker continues to decrease.  FU with Dr. Madison Hickman in 2 weeks.    07/08/19  C4 FOLFIRINOX.  Improved appetite.  CT prior to FU with Dr. Georgiann Hahn in 2 weeks.  Note that tumor marker is rising.      Active therapy FOLFIRINOX with growth factor    Conception Oms

## 2019-07-09 ENCOUNTER — Encounter: Admit: 2019-07-09 | Discharge: 2019-07-09 | Payer: MEDICARE

## 2019-07-09 NOTE — Progress Notes
Patient presented to clinic today with broken Infusystem pump after reportedly dropping it in her kitchen around 7pm last night. She had only received approximately 10ml.     Notified Vernona Rieger, APRN - Infusion rate recalculated over 30 hrs to finish by Friday afternoon so rate increased from 31ml/hr to 2.60ml/hr.      Discharged in good condition with new Infusystem pump running.

## 2019-07-10 ENCOUNTER — Encounter: Admit: 2019-07-10 | Discharge: 2019-07-10 | Payer: MEDICARE

## 2019-07-10 DIAGNOSIS — C259 Malignant neoplasm of pancreas, unspecified: Secondary | ICD-10-CM

## 2019-07-10 MED ORDER — PEGFILGRASTIM-CBQV 6 MG/0.6 ML SC SYRG
6 mg | Freq: Once | SUBCUTANEOUS | 0 refills | Status: CP
Start: 2019-07-10 — End: ?
  Administered 2019-07-10: 20:00:00 6 mg via SUBCUTANEOUS

## 2019-07-10 MED ORDER — HEPARIN, PORCINE (PF) 100 UNIT/ML IV SYRG
500 [IU] | Freq: Once | 0 refills | Status: CP
Start: 2019-07-10 — End: ?

## 2019-07-10 NOTE — Patient Instructions
Call Immediately to report the following:  Uncontrolled nausea and/or vomiting, uncontrolled pain, or unusual bleeding.  Temperature of 100.4 F or greater and/or any sign/symptom of infection (redness, warmth, tenderness)  Painful mouth or difficulty swallowing  Red, cracked, or painful hands and/or feet  Diarrhea   Swelling of arms or legs  Rash    Important Phone Numbers:  [facility] Main Number (answered 24 hours a day) [phone]  Cancer Center Scheduling (appointments) [phone] OR [phone]  [facility] (for nutritional supplements) [phone]        Port Maintenance - If you have a port, it should be flushed every 6-8 weeks when not in use.  Please check with your MD, nurse, or the scheduler.

## 2019-07-10 NOTE — Progress Notes
Cycle 4 Day 3 DC pump with no residual.     She has no complaints or changes to report.    Udenyca given per plan.    Discharged in good condition, ambulatory.

## 2019-07-20 ENCOUNTER — Encounter: Admit: 2019-07-20 | Discharge: 2019-07-20 | Payer: MEDICARE

## 2019-07-20 ENCOUNTER — Ambulatory Visit: Admit: 2019-07-20 | Discharge: 2019-07-20 | Payer: MEDICARE

## 2019-07-20 DIAGNOSIS — C259 Malignant neoplasm of pancreas, unspecified: Secondary | ICD-10-CM

## 2019-07-20 MED ORDER — SODIUM CHLORIDE 0.9 % IJ SOLN
50 mL | Freq: Once | INTRAVENOUS | 0 refills | Status: CP
Start: 2019-07-20 — End: ?
  Administered 2019-07-20: 21:00:00 50 mL via INTRAVENOUS

## 2019-07-20 MED ORDER — HEPARIN, PORCINE (PF) 100 UNIT/ML IV SYRG
500 [IU] | Freq: Once | 0 refills | Status: CP
Start: 2019-07-20 — End: ?

## 2019-07-20 MED ORDER — IOHEXOL 350 MG IODINE/ML IV SOLN
100 mL | Freq: Once | INTRAVENOUS | 0 refills | Status: CP
Start: 2019-07-20 — End: ?
  Administered 2019-07-20: 21:00:00 100 mL via INTRAVENOUS

## 2019-07-21 ENCOUNTER — Encounter: Admit: 2019-07-21 | Discharge: 2019-07-21 | Payer: MEDICARE

## 2019-07-21 DIAGNOSIS — C259 Malignant neoplasm of pancreas, unspecified: Secondary | ICD-10-CM

## 2019-07-21 DIAGNOSIS — M549 Dorsalgia, unspecified: Secondary | ICD-10-CM

## 2019-07-21 DIAGNOSIS — I81 Portal vein thrombosis: Secondary | ICD-10-CM

## 2019-07-21 DIAGNOSIS — G20 Parkinson's disease: Secondary | ICD-10-CM

## 2019-07-21 LAB — CA19.9: Lab: 232 U/mL — ABNORMAL HIGH (ref <35)

## 2019-07-21 MED ORDER — OXALIPLATIN IVPB
50 mg/m2 | Freq: Once | INTRAVENOUS | 0 refills | Status: CN
Start: 2019-07-21 — End: ?

## 2019-07-21 MED ORDER — OXALIPLATIN IVPB
50 mg/m2 | Freq: Once | INTRAVENOUS | 0 refills | Status: CP
Start: 2019-07-21 — End: ?
  Administered 2019-07-21 (×2): 89 mg via INTRAVENOUS

## 2019-07-21 MED ORDER — OXALIPLATIN IVPB
50 mg/m2 | Freq: Once | INTRAVENOUS | 0 refills
Start: 2019-07-21 — End: ?

## 2019-07-21 MED ORDER — DEXAMETHASONE 6 MG PO TAB
12 mg | Freq: Once | ORAL | 0 refills | Status: CP
Start: 2019-07-21 — End: ?
  Administered 2019-07-21: 16:00:00 12 mg via ORAL

## 2019-07-21 MED ORDER — PALONOSETRON 0.25 MG/5 ML IV SOLN
.25 mg | Freq: Once | INTRAVENOUS | 0 refills | Status: CP
Start: 2019-07-21 — End: ?
  Administered 2019-07-21: 16:00:00 0.25 mg via INTRAVENOUS

## 2019-07-21 MED ORDER — HEPARIN, PORCINE (PF) 100 UNIT/ML IV SYRG
500 [IU] | Freq: Once | 0 refills | Status: CP
Start: 2019-07-21 — End: ?

## 2019-07-21 MED ORDER — FLUOROURACIL IV AMB PUMP
2160 mg/m2 | Freq: Once | INTRAVENOUS | 0 refills | Status: CP
Start: 2019-07-21 — End: ?
  Administered 2019-07-21 (×2): 3845 mg via INTRAVENOUS

## 2019-07-21 MED ORDER — IRINOTECAN IVPB
135 mg/m2 | Freq: Once | INTRAVENOUS | 0 refills | Status: CP
Start: 2019-07-21 — End: ?
  Administered 2019-07-21 (×2): 240.4 mg via INTRAVENOUS

## 2019-07-21 MED ORDER — XARELTO DVT-PE TREAT 30D START 15 MG (42)- 20 MG (9) PO DSPK
ORAL_TABLET | Freq: Two times a day (BID) | ORAL | 0 refills | 30.00000 days | Status: AC
Start: 2019-07-21 — End: ?

## 2019-07-21 NOTE — Progress Notes
Cycle 5 Day 1 Folfirinox.   Port accessed, labs collected.  Patient to follow up with Dr. Georgiann Hahn.   OK to treat, labs OK to treat.  Tolerated infusion well.   Port de-accessed per protocol, heparinized.   Discharged in stable condition.     CHEMO NOTE  Verified chemo consent signed and in chart.    Verified initiate chemo order in O2    Blood return positive via: Port (Single)    BSA and dose double checked (agree with orders as written) with: yes     Labs/applicable tests checked: CBC and Comprehensive Metabolic Panel (CMP)    Chemo regime: Drug/cycle/day Folfirinox C5 D1     Rate verified and armband double checkwith second RN: yes see MAR    Patient education offered and stated understanding. Denies questions at this time.

## 2019-07-21 NOTE — Progress Notes
Name: Cassandra Wallace          MRN: [id number]      DOB: 1945/07/26      AGE: 74 y.o.   DATE OF SERVICE: 07/21/2019    Subjective:             Reason for Visit:  Follow Up      Cassandra Wallace is a 74 y.o. female.     Cancer Staging  Pancreatic cancer metastasized to liver [facility])  Staging form: Exocrine Pancreas, AJCC 8th Edition  - Clinical stage from 05/09/2019: Stage IV (cT4, cN0, pM1) - Signed by Lady Deutscher, MD on 05/09/2019      Cassandra Wallace is here for follow-up regarding her recent finding of stage IV adenocarcinoma from the pancreatic and liver biopsies.  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  Unfortunately a CT of the abdomen and pelvis done in [place] showed pancreatic lesions as well as 3 right hepatic lesions.  An MRI saw the same lesions.  An EUS performed on May 07, 2019 at [facility] unfortunately found to abnormal lesions in the left lobe of the liver and a pancreatic mass in the neck the pancreas was invading the superior mesenteric artery.  Biopsies of both came back consistent with adenocarcinoma.  A CA 19-9 came back as [name] which is elevated.  Her chemistries were normal.  After long discussion the patient began FOLFIRINOX chemotherapy growth factor support on about May 20, 2019.  The patient had a scan on 07/20/2019 showing stable and other lesions that are decreased in size.  It also showed portal vein thrombus.    I reviewed the scan with the patient.  She was very happy to hear that the scan looks improved.  We also talked about the portal vein clot as well as the right IJ clot.  Her husband is on Xarelto so she is familiar with it.  We discussed that we probably should treat this clot to help prevent further difficulties for her.  We also discussed there is some small but real increased risk for bleeding.  All in all he is probably safer to be on the blood thinner than not.  She is agrees.  A prescription will be called in.  We also talked about reducing her oxaliplatin because her platelets are 99,000.  We may have to stop this eventually.    Cassandra Wallace is a very supportive family including her husband Cassandra Wallace and her son Cassandra Wallace, who is a Land here in town.  I believe there are other children in the family.    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  He mostly worked in [place].  His name is Czechoslovakian/Bohemian.  We talked about ethnic foods.  Cassandra Wallace on the other hand is originally [place] and her ethnicity.  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    In addition to the history of metastatic pancreatic cancer diagnosis and portal vein thrombosis, the patient also has a history of Parkinson's though she has been very stable.    The patient is fully vaccinated regarding the Covid virus.    The patient will continue with that FOLFIRINOX with dose modifications.  She will see [place] in 2 in 4  weeks.  We will begin her on Xarelto for the portal vein thrombus.           Review of Systems   Constitutional: Negative for chills and fever.   HENT: Negative for nosebleeds.    Eyes: Negative for pain and visual disturbance.   Respiratory: Negative for cough and shortness of breath.    Cardiovascular: Negative for chest pain and leg swelling.   Gastrointestinal: Positive for constipation and diarrhea. Negative for abdominal pain.   Genitourinary: Negative for hematuria.   Musculoskeletal: Negative for joint swelling and neck pain.   Skin: Negative for rash.   Neurological: Positive for light-headedness. Negative for dizziness and weakness.   Hematological: Negative for adenopathy.   Psychiatric/Behavioral: Negative for confusion.   All other systems reviewed and are negative.        Objective:         ? carbidopa/levodopa (SINEMET) 25/100 mg tablet Take 1 tablet by mouth three times daily.   ? lidocaine/prilocaine (EMLA) 2.5/2.5 % topical cream Apply a thin film over port one hour prior to treatment. Do not rub in. Cover with plastic wrap after application   ? loperamide (IMODIUM A-D) 2 mg capsule Take 2 capsules by mouth after first loose/frequent bowel movement, then 1 capsule every 2 hours (2 capsules every 4 hours at night) until 12 hours have passed without a bowel movement.   ? ondansetron (ZOFRAN) 8 mg tablet Take one tablet by mouth every 8 hours as needed for Nausea.     Vitals:    07/21/19 1000   BP: 122/74   BP Source: Arm, Right Upper   Patient Position: Sitting   Pulse: 100   Resp: 16   Temp: 36.6 ?C (97.8 ?F)   TempSrc: Oral   SpO2: 99%   Weight: 61.9 kg (136 lb 6.4 oz)   Height: 161.3 cm (63.5)   PainSc: Zero     Body mass index is 23.78 kg/m?Marland Kitchen     Medical History:   Diagnosis Date   ? Back pain    ? Pancreatic cancer (HCC)     mets to liver   ? Parkinson's disease (HCC) 2016     Surgical History:   Procedure Laterality Date   ? CATARACT REMOVAL Bilateral 2020   ? ESOPHAGOGASTRODUODENOSCOPY WITH TRANSENDOSCOPIC ULTRASOUND GUIDED FINE NEEDLE ASPIRATION/ BIOPSY N/A 05/07/2019    Performed by Comer Locket, MD at [facility] ENDO      Social History     Tobacco Use   ? Smoking status: Never Smoker   ? Smokeless tobacco: Never Used   Substance Use Topics   ? Alcohol use: Not Currently   ? Drug use: Never     Family History   Problem Relation Age of Onset   ? Cancer-Uterine Mother    ? Arthritis-rheumatoid Father          Pain Score: Zero            Pain Addressed:  N/A    Patient Evaluated for a Clinical Trial: No treatment clinical trial available for this patient.     [name] Cooperative Oncology Group performance status is 1, Restricted in physically strenuous activity but ambulatory and able to carry out work of a light or sedentary nature, e.g., light house work, office work.     Physical Exam  Vitals reviewed.   Constitutional: General: She is not in acute distress.     Appearance: Normal appearance. She is well-developed. She is not ill-appearing.  HENT:      Head: Normocephalic and atraumatic.   Eyes:      Conjunctiva/sclera: Conjunctivae normal.   Neck:      Vascular: No JVD.      Trachea: No tracheal deviation.   Cardiovascular:      Rate and Rhythm: Normal rate and regular rhythm.      Heart sounds: No friction rub. No gallop.    Pulmonary:      Effort: Pulmonary effort is normal. No respiratory distress.      Breath sounds: Normal breath sounds. No stridor. No wheezing, rhonchi or rales.   Abdominal:      General: There is no distension.      Palpations: Abdomen is soft. There is no mass.      Tenderness: There is no abdominal tenderness.   Musculoskeletal:         General: Normal range of motion.      Cervical back: Normal range of motion.   Lymphadenopathy:      Head:      Right side of head: No submental or submandibular adenopathy.      Left side of head: No submental or submandibular adenopathy.      Cervical: No cervical adenopathy.      Right cervical: No superficial or posterior cervical adenopathy.     Left cervical: No superficial or posterior cervical adenopathy.      Upper Body:      Right upper body: No supraclavicular or epitrochlear adenopathy.      Left upper body: No supraclavicular or epitrochlear adenopathy.   Skin:     General: Skin is warm and dry.      Coloration: Skin is not jaundiced.      Findings: No rash.   Neurological:      Mental Status: She is alert and oriented to person, place, and time.      Cranial Nerves: No cranial nerve deficit.      Motor: No weakness.      Gait: Gait normal.   Psychiatric:         Mood and Affect: Mood normal.         Behavior: Behavior normal.         Thought Content: Thought content normal.         Judgment: Judgment normal.               Assessment and Plan:  Patient Active Problem List    Diagnosis Date Noted   ? Pancreatic cancer metastasized to liver [facility]) 05/07/2019 Priority: Medium     04/13/2019 [facility] medical imaging [facility] CT abdomen pelvis with contrast: Abdominal pain constipation unintentional weight loss.  The head of the pancreas is 3 hypodense lesions.  The lesion towards the right lateral side measures 1.8 cm.  Anterior lesion the mild/mid head measures 11.4 cm.  There are 3 right hepatic lesions that are suspicious.  Impression: Multifocal pancreas lesion suspicious for tumor such as adenocarcinoma.  Could also be focal inflammation from subacute or chronic pancreatitis.  04/20/2019 [place] medical imaging [facility] MRI abdomen without and with contrast.  Indeterminate lesions in the pancreatic bed and proximal body.  Pancreatic neoplasm cannot be excluded.  This is compared to CT/12/2019.  Several right and left hepatic lesions are present.  The 2 largest are adjacent to 1 another.  Measuring 2.1 x 1.8 cm.  Concerning for hepatic metastatic lesions.  Heterogeneous process pancreatic head and proximal body the whole area measures  approximately 5.2 x 3.5 cm.  It appears to be least some encasement of the associated narrowing of the superior mesenteric vein  04/23/2019 [facility] note.  Patient had Covid in November 2020.  Recent imaging showed spots on pancreas.  History of Parkinson's weight 156 pounds  05/07/2019 [place] EUS report examination of the left lobe of the liver revealed 2 lesions measuring 1 cm each.  FNA x4 was performed upper limit result is positive for malignant cells.  Examination of pancreatic revealed a mass in the neck the pancreas with invasion to SMA.  Fine-needle biopsy was obtained and preliminary is all is positive for malignant cells.  Await cytology results and await path results.  05/07/2019 CA 19-9 1782, H 12.3, platelets 120 low, W3.5 low, chemistries normal,  05/07/2019 [place] pathology/cytology liver US-FNA malignant cells present consistent with adenocarcinoma.  Pancreatic neck EUS FNA: Malignant cells present consistent with adenocarcinoma.  Mismatch repair proteins are ordered and will reported in an addendum.  Microsatellite instability test by PCR is also been ordered and will be reported separately.  Part A contain sufficient tissue for further testing.  05/11/2019 this is patient's first visit with [facility] oncology.  Husband Cassandra Wallace participated in person son Cassandra Wallace who is a Land, by phone.  Discussed biopsy results and also stage and prognosis of stage IV pancreatic cancer.  Talked about treatment options including FOLFIRINOX with growth factor, Gemzar and Abraxane, single agent Gemzar, no therapy/palliative care.  Patient is very good performance status.  Would suggest slightly dose reduced FOLFIRINOX with growth factor.  Patient will think about it but is willing to set things in motion for [employer] placement and chemotherapy and teaching etc.  They plan to move here from [facility] in the near future.  05/14/2019 [place] CT chest multiple tiny bilateral pulmonary nodules suspicious for metastatic disease the likely 2 small for definitive PET/CT characterization or biopsy.  A follow-up chest CT in 2-3 months is suggested to assess for stability.  No thoracic adenopathy.  05/19/19: FOLFIRINOX education  05/20/19 Started modified FOLFIRINOX  05/26/19: Toxicity check: Tolerated first cycle relatively well. Minimal nausea and diarrhea. 1 liter of fluids in clinic today. Having back pain. Recommended trying some ibuprofen, heat and massage, reassess next week with Dr. Madison Hickman.  06/03/2019 cycle cycle 1 day 15 FOLFIRINOX appetite improving less back pain all very encouraging tumor marker pending lab work looks good.  Proceed with cycle 1 day 15 see nurse in 2 weeks physician in 4 weeks imaging after 2 months  06/16/19  C3 FOLFIRINOX.  Tolerating therapy very well.  Tumor marker continues to decrease.  FU with Dr. Madison Hickman in 2 weeks.    07/08/19  C4 FOLFIRINOX.  Improved appetite.  CT prior to FU with Dr. Georgiann Hahn in 2 weeks.  Note that tumor marker is rising.   07/20/2019  CT CAP: Questionable nonocclusive filling defect in partially visualized right IJ vein.  May be from mixing artifact versus small clot.  No thoracic adenopathy.  No significant change in scattered indeterminate bilateral pulmonary nodules.  No significant change in size of infiltrative pancreatic malignancy since 05/14/2019.  Persistent encasement.  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  Additional small nonocclusive thrombus in the left renal vein.  Dominant right hepatic metastasis are not significantly changed since 05/14/2019.  Smaller scattered hepatic metastasis are decreased in size.  No new abdominal  pelvic mass or ascites.  07/21/2019 cycle 5 FOLFIRINOX with Neulasta support.  Platelets 99,000.  Will decrease oxaliplatin further to 50 mg/metered squared.  Discussed clot with patient.  No bleeding reported.  Will begin Xarelto.  Husband is on same medications.  Patient aware and discussed risks and benefits.  Will return in 2 in 4 weeks for additional chemo.    Active therapy FOLFIRINOX with growth factor    [place]       ? 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  Patient aware to call if bleeding or unusual bruising.  Has been on same medication and is fairly familiar.     ? Dehydration 05/26/2019   ? 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 CINV (chemotherapy-induced nausea and vomiting) 05/26/2019   ? Dizziness 05/26/2019   ? Diarrhea 05/26/2019   ? Parkinson's disease (HCC) 05/11/2019     This note was created with partial dictation using a dragon dictation system, please notify us if you notice any errors of omissions or content.

## 2019-07-21 NOTE — Patient Instructions
Call Immediately to report the following:  Uncontrolled nausea and/or vomiting, uncontrolled pain, or unusual bleeding.  Temperature of 100.4 F or greater and/or any sign/symptom of infection (redness, warmth, tenderness)  Painful mouth or difficulty swallowing  Red, cracked, or painful hands and/or feet  Diarrhea   Swelling of arms or legs  Rash    Important Phone Numbers:  [facility] Main Number (answered 24 hours a day) [phone]  Cancer Center Scheduling (appointments) [phone] OR [phone]  [facility] (for nutritional supplements) [phone]        Port Maintenance - If you have a port, it should be flushed every 6-8 weeks when not in use.  Please check with your MD, nurse, or the scheduler.

## 2019-07-23 ENCOUNTER — Encounter: Admit: 2019-07-23 | Discharge: 2019-07-23 | Payer: MEDICARE

## 2019-07-23 DIAGNOSIS — C259 Malignant neoplasm of pancreas, unspecified: Secondary | ICD-10-CM

## 2019-07-23 DIAGNOSIS — G20 Parkinson's disease: Secondary | ICD-10-CM

## 2019-07-23 DIAGNOSIS — M549 Dorsalgia, unspecified: Secondary | ICD-10-CM

## 2019-07-23 MED ORDER — PEGFILGRASTIM-CBQV 6 MG/0.6 ML SC SYRG
6 mg | Freq: Once | SUBCUTANEOUS | 0 refills | Status: CP
Start: 2019-07-23 — End: ?
  Administered 2019-07-23: 19:00:00 6 mg via SUBCUTANEOUS

## 2019-07-23 MED ORDER — HEPARIN, PORCINE (PF) 100 UNIT/ML IV SYRG
500 [IU] | Freq: Once | 0 refills | Status: CP
Start: 2019-07-23 — End: ?

## 2019-07-23 NOTE — Progress Notes
Cycle 6 Day 3 [place].  Pump DC'd.  Port flushed, blood returned.  Port de-accessed, heparinized.  Injection given per plan.   Tolerated well.   Patient discharged in stable condition.

## 2019-08-11 ENCOUNTER — Encounter: Admit: 2019-08-11 | Discharge: 2019-08-11 | Payer: MEDICARE

## 2019-08-11 DIAGNOSIS — C259 Malignant neoplasm of pancreas, unspecified: Secondary | ICD-10-CM

## 2019-08-11 DIAGNOSIS — G20 Parkinson's disease: Secondary | ICD-10-CM

## 2019-08-11 DIAGNOSIS — M549 Dorsalgia, unspecified: Secondary | ICD-10-CM

## 2019-08-11 LAB — CA19.9: Lab: 248 U/mL — ABNORMAL HIGH (ref <35)

## 2019-08-11 MED ORDER — PALONOSETRON 0.25 MG/5 ML IV SOLN
.25 mg | Freq: Once | INTRAVENOUS | 0 refills | Status: CP
Start: 2019-08-11 — End: ?
  Administered 2019-08-11: 15:00:00 0.25 mg via INTRAVENOUS

## 2019-08-11 MED ORDER — ATROPINE 0.4 MG/ML IJ SOLN
0.4 mg | INTRAVENOUS | 0 refills | Status: DC | PRN
Start: 2019-08-11 — End: 2019-08-16
  Administered 2019-08-11: 18:00:00 0.4 mg via INTRAVENOUS

## 2019-08-11 MED ORDER — DEXAMETHASONE 6 MG PO TAB
12 mg | Freq: Once | ORAL | 0 refills | Status: CP
Start: 2019-08-11 — End: ?
  Administered 2019-08-11: 15:00:00 12 mg via ORAL

## 2019-08-11 MED ORDER — OXALIPLATIN IVPB
50 mg/m2 | Freq: Once | INTRAVENOUS | 0 refills | Status: CP
Start: 2019-08-11 — End: ?
  Administered 2019-08-11 (×2): 89 mg via INTRAVENOUS

## 2019-08-11 MED ORDER — IRINOTECAN IVPB
135 mg/m2 | Freq: Once | INTRAVENOUS | 0 refills | Status: CP
Start: 2019-08-11 — End: ?
  Administered 2019-08-11 (×3): 240.4 mg via INTRAVENOUS

## 2019-08-11 MED ORDER — FLUOROURACIL IV AMB PUMP
2160 mg/m2 | Freq: Once | INTRAVENOUS | 0 refills | Status: CP
Start: 2019-08-11 — End: ?
  Administered 2019-08-11 (×2): 3845 mg via INTRAVENOUS

## 2019-08-11 NOTE — Progress Notes
Name: Cassandra Wallace          MRN: [id number]      DOB: 06/24/1945      AGE: 74 y.o.   DATE OF SERVICE: 08/11/2019    Subjective:             Reason for Visit:  No chief complaint on file.      Cassandra Wallace is a 74 y.o. female.     Cancer Staging  Pancreatic cancer metastasized to liver [facility])  Staging form: Exocrine Pancreas, AJCC 8th Edition  - Clinical stage from 05/09/2019: Stage IV (cT4, cN0, pM1) - Signed by Lady Deutscher, MD on 05/09/2019      History of Present Illness  Cassandra Wallace is here today in FU of her metastatic pancreatic cancer and C4 FOLFIRINOX chemotherapy.??She has a history of abdominal pain, constipation and unintentional weight loss. ?Unfortunately a CT ?abdomen and pelvis done in [place] showed pancreatic lesions as well as 3 right hepatic lesions. ?An MRI saw the same lesions. ?An EUS performed on May 07, 2019 at [place] found to abnormal lesions in the left lobe of the liver and a pancreatic mass in the neck the pancreas that was invading the superior mesenteric artery. Biopsies of both came back consistent with adenocarcinoma. A CA 19-9 came back as Cassandra Wallace which is elevated.??  ?  -- Started?FOLFIRINOX ?on 05/20/19.    --  CT on 07/20/19 showed stable disease.  Incidental finding of portal vein thrombus.  Pt started on Xarelto.      Interim hx:  Had severe diarrhea last week and was unable to open Imodium blister pack.  Sx have resolved.  Pt was drinking lots of water, Pedialyte and Gatorade.    ?       Review of Systems   Constitutional: Negative.  Negative for fatigue.   HENT: Negative.  Negative for mouth sores.    Respiratory: Negative.    Cardiovascular: Negative.    Gastrointestinal: Negative for diarrhea (resolved.  Was significatn last week) and nausea.   Genitourinary: Negative.    Musculoskeletal: Negative.    Skin: Negative.  Negative for rash.   Neurological: Negative.  Negative for numbness.   Hematological: Negative.    Psychiatric/Behavioral: Negative.    All other systems reviewed and are negative.        Objective:         ? carbidopa/levodopa (SINEMET) 25/100 mg tablet Take 1 tablet by mouth three times daily.   ? lidocaine/prilocaine (EMLA) 2.5/2.5 % topical cream Apply a thin film over port one hour prior to treatment. Do not rub in. Cover with plastic wrap after application   ? loperamide (IMODIUM A-D) 2 mg capsule Take 2 capsules by mouth after first loose/frequent bowel movement, then 1 capsule every 2 hours (2 capsules every 4 hours at night) until 12 hours have passed without a bowel movement.   ? ondansetron (ZOFRAN) 8 mg tablet Take one tablet by mouth every 8 hours as needed for Nausea.   ? rivaroxaban (XARELTO DVT-PE TREAT 30D START) 15 mg (42)- 20 mg (9) 51 tablet pack Take 15 mg by mouth TWICE daily with food for 21 days followed by 20 mg by mouth ONCE daily with food as directed on package.     There were no vitals filed for this visit.  There is no height or weight on file to calculate BMI.  Pain Addressed:  N/A    Patient Evaluated for a Clinical Trial: No treatment clinical trial available for this patient.     [name] Cooperative Oncology Group performance status is 0, Fully active, able to carry on all pre-disease performance without restriction.Marland Kitchen     Physical Exam  Vitals reviewed.   Constitutional:       Appearance: Normal appearance.   HENT:      Head: Normocephalic.   Eyes:      Extraocular Movements: Extraocular movements intact.   Cardiovascular:      Rate and Rhythm: Normal rate.   Pulmonary:      Effort: Pulmonary effort is normal.   Musculoskeletal:         General: Normal range of motion.      Cervical back: Normal range of motion.   Skin:     General: Skin is warm and dry.   Neurological:      General: No focal deficit present.      Mental Status: She is alert and oriented to person, place, and time.   Psychiatric:         Mood and Affect: Mood normal.         Behavior: Behavior normal.         Thought Content: Thought content normal. Results for orders placed or performed during the hospital encounter of 08/11/19 (from the past 336 hour(s))   CBC AND DIFF   Result Value Ref Range    White Blood Cells 6.9 4.5 - 11.0 K/UL    RBC 3.58 (L) 4.0 - 5.0 M/UL    Hemoglobin 11.1 (L) 12.0 - 15.0 GM/DL    Hematocrit 16.1 (L) 36 - 45 %    MCV 95.3 80 - 100 FL    MCH 30.9 26 - 34 PG    MCHC 32.5 32.0 - 36.0 G/DL    RDW 09.6 (H) 11 - 15 %    Platelet Count 271 150 - 400 K/UL    MPV 7.6 7 - 11 FL    Neutrophils 68 41 - 77 %    Lymphocytes 18 (L) 24 - 44 %    Monocytes 12 4 - 12 %    Eosinophils 1 0 - 5 %    Basophils 1 0 - 2 %    Absolute Neutrophil Count 4.80 1.8 - 7.0 K/UL    Absolute Lymph Count 1.20 1.0 - 4.8 K/UL    Absolute Monocyte Count 0.80 0 - 0.80 K/UL    Absolute Eosinophil Count 0.00 0 - 0.45 K/UL    Absolute Basophil Count 0.10 0 - 0.20 K/UL          Assessment and Plan:       Patient Active Problem List    Diagnosis Date Noted   ? 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  Patient aware to call if bleeding or unusual bruising.  Has been on same medication and is fairly familiar.     ? Dehydration 05/26/2019   ? 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 CINV (chemotherapy-induced nausea and vomiting) 05/26/2019   ? Dizziness 05/26/2019   ? Diarrhea 05/26/2019   ? Parkinson's disease (HCC) 05/11/2019   ? Pancreatic cancer metastasized to liver [facility]) 05/07/2019     04/13/2019 [facility] medical imaging [facility] CT abdomen pelvis with contrast: Abdominal  pain constipation unintentional weight loss.  The head of the pancreas is 3 hypodense lesions.  The lesion towards the right lateral side measures 1.8 cm.  Anterior lesion the mild/mid head measures 11.4 cm.  There are 3 right hepatic lesions that are suspicious.  Impression: Multifocal pancreas lesion suspicious for tumor such as adenocarcinoma.  Could also be focal inflammation from subacute or chronic pancreatitis.  04/20/2019 [place] medical imaging [facility] MRI abdomen without and with contrast.  Indeterminate lesions in the pancreatic bed and proximal body.  Pancreatic neoplasm cannot be excluded.  This is compared to CT/12/2019.  Several right and left hepatic lesions are present.  The 2 largest are adjacent to 1 another.  Measuring 2.1 x 1.8 cm.  Concerning for hepatic metastatic lesions.  Heterogeneous process pancreatic head and proximal body the whole area measures approximately 5.2 x 3.5 cm.  It appears to be least some encasement of the associated narrowing of the superior mesenteric vein  04/23/2019 [facility] clinic note.  Patient had Covid in November 2020.  Recent imaging showed spots on pancreas.  History of Parkinson's weight 156 pounds  05/07/2019 [place] EUS report examination of the left lobe of the liver revealed 2 lesions measuring 1 cm each.  FNA x4 was performed upper limit result is positive for malignant cells.  Examination of pancreatic revealed a mass in the neck the pancreas with invasion to SMA.  Fine-needle biopsy was obtained and preliminary is all is positive for malignant cells.  Await cytology results and await path results.  05/07/2019 CA 19-9 1782, H 12.3, platelets 120 low, W3.5 low, chemistries normal,  05/07/2019 [facility] pathology/cytology liver US-FNA malignant cells present consistent with adenocarcinoma.  Pancreatic neck EUS FNA: Malignant cells present consistent with adenocarcinoma.  Mismatch repair proteins are ordered and will reported in an addendum.  Microsatellite instability test by PCR is also been ordered and will be reported separately.  Part A contain sufficient tissue for further testing.  05/11/2019 this is patient's first visit with [facility] oncology.  Husband Ron participated in person son Trey Paula who is a [employer], by phone.  Discussed biopsy results and also stage and prognosis of stage IV pancreatic cancer.  Talked about treatment options including FOLFIRINOX with growth factor, Gemzar and Abraxane, single agent Gemzar, no therapy/palliative care.  Patient is very good performance status.  Would suggest slightly dose reduced FOLFIRINOX with growth factor.  Patient will think about it but is willing to set things in motion for PowerPort placement and chemotherapy and teaching etc.  They plan to move here from [facility] in the near future.  05/14/2019 [place] CT chest multiple tiny bilateral pulmonary nodules suspicious for metastatic disease the likely 2 small for definitive PET/CT characterization or biopsy.  A follow-up chest CT in 2-3 months is suggested to assess for stability.  No thoracic adenopathy.  05/19/19: FOLFIRINOX education  05/20/19 Started modified FOLFIRINOX  05/26/19: Toxicity check: Tolerated first cycle relatively well. Minimal nausea and diarrhea. 1 liter of fluids in clinic today. Having back pain. Recommended trying some ibuprofen, heat and massage, reassess next week with Dr. Madison Hickman.  06/03/2019 cycle cycle 1 day 15 FOLFIRINOX appetite improving less back pain all very encouraging tumor marker pending lab work looks good.  Proceed with cycle 1 day 15 see nurse in 2 weeks physician in 4 weeks imaging after 2 months  06/16/19  C3 FOLFIRINOX.  Tolerating therapy very well.  Tumor marker continues to decrease.  FU with Dr. Madison Hickman in 2  weeks.    07/08/19  C4 FOLFIRINOX.  Improved appetite.  CT prior to FU with Dr. Georgiann Hahn in 2 weeks.  Note that tumor marker is rising.   07/20/2019 [place] CT CAP: Questionable nonocclusive filling defect in partially visualized right IJ vein.  May be from mixing artifact versus small clot.  No thoracic adenopathy.  No significant change in scattered indeterminate bilateral pulmonary nodules.  No significant change in size of infiltrative pancreatic malignancy since 05/14/2019.  Persistent encasement.  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  Additional small nonocclusive thrombus in the left renal vein.  Dominant right hepatic metastasis are not significantly changed since 05/14/2019.  Smaller scattered hepatic metastasis are decreased in size.  No new abdominal pelvic mass or ascites.  07/21/2019 cycle 5 FOLFIRINOX with Neulasta support.  Platelets 99,000.  Will decrease oxaliplatin further to 50 mg/metered squared.  Discussed clot with patient.  No bleeding reported.  Will begin Xarelto.  Husband is on same medications.  Patient aware and discussed risks and benefits.  Will return in 2 in 4 weeks for additional chemo.  08/11/19  Pt is 1 week later for C6 FOLFIRINOX due to severe diarrhea last week.  Sx have resolved.  She will take Imodium prn.  FU with Dr. Madison Hickman in 2 weeks.      Active therapy FOLFIRINOX with growth factor    [place]         '

## 2019-08-11 NOTE — Patient Instructions
Call Immediately to report the following:  Uncontrolled nausea and/or vomiting, uncontrolled pain, or unusual bleeding.  Temperature of 100.4 F or greater and/or any sign/symptom of infection (redness, warmth, tenderness)  Painful mouth or difficulty swallowing  Red, cracked, or painful hands and/or feet  Diarrhea   Swelling of arms or legs  Rash    Important Phone Numbers:  [facility] Main Number (answered 24 hours a day) [phone]  Cancer Center Scheduling (appointments) [phone] OR [phone]  [facility] (for nutritional supplements) [phone]        Port Maintenance - If you have a port, it should be flushed every 6-8 weeks when not in use.  Please check with your MD, nurse, or the scheduler.

## 2019-08-11 NOTE — Progress Notes
Day 1 Cycle 6 FOLFIRINOX     Port accessed, labs collected.  Patient to follow up with Vernona Rieger, NP.   OK to treat, labs OK to treat.  Tolerated infusions well.   Discharged in stable condition with Adrucil infusing via ambulatory pump.     CHEMO NOTE  Verified chemo consent signed and in chart.    Verified initiate chemo order in O2    Blood return positive via: Port (Single and Accessed)    BSA and dose double checked (agree with orders as written) with: yes     Labs/applicable tests checked: CBC and Comprehensive Metabolic Panel (CMP)    Chemo regime: Drug/cycle/day Oxaliplatin + Irinotecan + Adrucil C6D1    Rate verified and armband double checkwith second RN: yes, see EMAR.     Patient education offered and stated understanding. Denies questions at this time.

## 2019-08-13 ENCOUNTER — Encounter: Admit: 2019-08-13 | Discharge: 2019-08-13 | Payer: MEDICARE

## 2019-08-13 DIAGNOSIS — G20 Parkinson's disease: Secondary | ICD-10-CM

## 2019-08-13 DIAGNOSIS — C259 Malignant neoplasm of pancreas, unspecified: Secondary | ICD-10-CM

## 2019-08-13 DIAGNOSIS — M549 Dorsalgia, unspecified: Secondary | ICD-10-CM

## 2019-08-13 MED ORDER — PEGFILGRASTIM-CBQV 6 MG/0.6 ML SC SYRG
6 mg | Freq: Once | SUBCUTANEOUS | 0 refills | Status: CP
Start: 2019-08-13 — End: ?
  Administered 2019-08-13: 18:00:00 6 mg via SUBCUTANEOUS

## 2019-08-13 MED ORDER — HEPARIN, PORCINE (PF) 100 UNIT/ML IV SYRG
500 [IU] | Freq: Once | 0 refills | Status: CP
Start: 2019-08-13 — End: ?

## 2019-08-13 NOTE — Progress Notes
Pt in treatment for CADD unhook.  Port and dressing assessed, CADD pump stopped, port flushed per protocol, blood return noted.  Pt de-accessed, skin at port site clean, dry, and intact.      Udenyca SQ injection given on pt's abd tissue. Pt tolerated well.    Pt denies further needs, pt dismissed ambulatory to home

## 2019-08-27 ENCOUNTER — Encounter: Admit: 2019-08-27 | Discharge: 2019-08-27 | Payer: MEDICARE

## 2019-08-31 ENCOUNTER — Encounter: Admit: 2019-08-31 | Discharge: 2019-08-31 | Payer: MEDICARE

## 2019-08-31 NOTE — Telephone Encounter
Patient was exposed to her granddaughter this weekend who had a rapid covid test come back positive today. The granddaughter was exposed at daycare and had been home last week due to daycare closed due to the covid + exposure. Patient and her husband were exposed but are asymptomatic. Patient will defer her follow up, lab and treatment for a week. Patient will contact us if she becomes symptomatic.

## 2019-09-08 ENCOUNTER — Encounter: Admit: 2019-09-08 | Discharge: 2019-09-08 | Payer: MEDICARE

## 2019-09-08 MED ORDER — IRINOTECAN IVPB
135 mg/m2 | Freq: Once | INTRAVENOUS | 0 refills
Start: 2019-09-08 — End: ?

## 2019-09-08 MED ORDER — OXALIPLATIN IVPB
50 mg/m2 | Freq: Once | INTRAVENOUS | 0 refills
Start: 2019-09-08 — End: ?

## 2019-09-08 MED ORDER — FLUOROURACIL IV AMB PUMP
2160 mg/m2 | Freq: Once | INTRAVENOUS | 0 refills
Start: 2019-09-08 — End: ?

## 2019-09-09 ENCOUNTER — Encounter: Admit: 2019-09-09 | Discharge: 2019-09-09 | Payer: MEDICARE

## 2019-09-09 DIAGNOSIS — G20 Parkinson's disease: Secondary | ICD-10-CM

## 2019-09-09 DIAGNOSIS — C259 Malignant neoplasm of pancreas, unspecified: Secondary | ICD-10-CM

## 2019-09-09 DIAGNOSIS — M549 Dorsalgia, unspecified: Secondary | ICD-10-CM

## 2019-09-09 LAB — CA19.9: Lab: 252 U/mL — ABNORMAL HIGH (ref <35)

## 2019-09-09 MED ORDER — ATROPINE 0.4 MG/ML IJ SOLN
0.4 mg | INTRAVENOUS | 0 refills | Status: AC | PRN
Start: 2019-09-09 — End: ?

## 2019-09-09 MED ORDER — IRINOTECAN IVPB
135 mg/m2 | Freq: Once | INTRAVENOUS | 0 refills | Status: CP
Start: 2019-09-09 — End: ?
  Administered 2019-09-09 (×2): 225.4 mg via INTRAVENOUS

## 2019-09-09 MED ORDER — DEXAMETHASONE 6 MG PO TAB
12 mg | Freq: Once | ORAL | 0 refills | Status: CP
Start: 2019-09-09 — End: ?
  Administered 2019-09-09: 15:00:00 12 mg via ORAL

## 2019-09-09 MED ORDER — PALONOSETRON 0.25 MG/5 ML IV SOLN
.25 mg | Freq: Once | INTRAVENOUS | 0 refills | Status: CP
Start: 2019-09-09 — End: ?
  Administered 2019-09-09: 15:00:00 0.25 mg via INTRAVENOUS

## 2019-09-09 MED ORDER — FLUOROURACIL IV AMB PUMP
2160 mg/m2 | Freq: Once | INTRAVENOUS | 0 refills | Status: CP
Start: 2019-09-09 — End: ?
  Administered 2019-09-09 (×2): 3607 mg via INTRAVENOUS

## 2019-09-09 MED ORDER — OXALIPLATIN IVPB
50 mg/m2 | Freq: Once | INTRAVENOUS | 0 refills | Status: CP
Start: 2019-09-09 — End: ?
  Administered 2019-09-09 (×2): 83.5 mg via INTRAVENOUS

## 2019-09-09 MED ORDER — HEPARIN, PORCINE (PF) 100 UNIT/ML IV SYRG
500 [IU] | Freq: Once | 0 refills | Status: CP
Start: 2019-09-09 — End: ?

## 2019-09-09 NOTE — Patient Instructions
Call Immediately to report the following:  Uncontrolled nausea and/or vomiting, uncontrolled pain, or unusual bleeding.  Temperature of 100.4 F or greater and/or any sign/symptom of infection (redness, warmth, tenderness)  Painful mouth or difficulty swallowing  Red, cracked, or painful hands and/or feet  Diarrhea   Swelling of arms or legs  Rash    Important Phone Numbers:  [facility] Main Number (answered 24 hours a day) [phone]  Cancer Center Scheduling (appointments) [phone] OR [phone]  [facility] (for nutritional supplements) [phone]        Port Maintenance - If you have a port, it should be flushed every 6-8 weeks when not in use.  Please check with your MD, nurse, or the scheduler.

## 2019-09-09 NOTE — Progress Notes
CHEMO NOTE  Verified chemo consent signed and in chart.    Verified initiate chemo order in O2    Blood return positive via: Port (Single)    BSA and dose double checked (agree with orders as written) with: yes with Al Decant, RN    Labs/applicable tests checked: CBC and Comprehensive Metabolic Panel (CMP)    Chemo regime: C7D1 Folfirinox    Rate verified and armband double checkwith second RN: yes    Patient education offered and stated understanding. Denies questions at this time.      Patient presented to clinic for treatment. Patient's port accessed with positive blood return. Patient's labs okay to treat. Patient tolerated infusion well. Patient's port connected to ambulatory pump. Patient discharged off unit via ambulation in good condition.

## 2019-09-09 NOTE — Progress Notes
Name: Cassandra Wallace          MRN: [id number]      DOB: 1945/03/20      AGE: 74 y.o.   DATE OF SERVICE: 09/09/2019    Subjective:             Reason for Visit:  Heme/Onc Care      Cassandra Wallace is a 74 y.o. female.     Cancer Staging  Pancreatic cancer metastasized to liver [facility])  Staging form: Exocrine Pancreas, AJCC 8th Edition  - Clinical stage from 05/09/2019: Stage IV (cT4, cN0, pM1) - Signed by Lady Deutscher, MD on 05/09/2019      History of Present Illness  Cassandra Wallace is here today in FU of her metastatic pancreatic cancer and C7?FOLFIRINOX chemotherapy.??She has a history of abdominal pain, constipation and unintentional weight loss. ?Unfortunately a CT ?abdomen and pelvis done in [place] showed pancreatic lesions as well as 3 right hepatic lesions. ?An MRI saw the same lesions. ?An EUS performed on May 07, 2019 at [place] found to abnormal lesions in the left lobe of the liver and a pancreatic mass in the neck the pancreas that was invading the superior mesenteric artery. Biopsies of both came back consistent with adenocarcinoma. A CA 19-9 came back as [name] which is elevated.??  ?  -- Started?FOLFIRINOX ?on 05/20/19.  ?  --  CT on 07/20/19 showed stable disease.  Incidental finding of portal vein thrombus.  Pt started on Xarelto.    ?  Interim hx:  1 week late for chemo due to COVID exposure.  Pt was never symptomatic.  No new AEs.         Review of Systems   Constitutional: Negative.  Negative for fatigue and fever.   HENT: Negative.    Respiratory: Negative.    Cardiovascular: Negative.    Gastrointestinal: Negative.    Genitourinary: Negative.    Musculoskeletal: Negative.    Skin: Negative.    Neurological: Negative.  Negative for numbness.   Hematological: Negative.    Psychiatric/Behavioral: Negative.    All other systems reviewed and are negative.        Objective:         ? carbidopa/levodopa (SINEMET) 25/100 mg tablet Take 1 tablet by mouth three times daily.   ? lidocaine/prilocaine (EMLA) 2.5/2.5 % topical cream Apply a thin film over port one hour prior to treatment. Do not rub in. Cover with plastic wrap after application   ? loperamide (IMODIUM A-D) 2 mg capsule Take 2 capsules by mouth after first loose/frequent bowel movement, then 1 capsule every 2 hours (2 capsules every 4 hours at night) until 12 hours have passed without a bowel movement.   ? ondansetron (ZOFRAN) 8 mg tablet Take one tablet by mouth every 8 hours as needed for Nausea.   ? rivaroxaban (XARELTO DVT-PE TREAT 30D START) 15 mg (42)- 20 mg (9) 51 tablet pack Take 15 mg by mouth TWICE daily with food for 21 days followed by 20 mg by mouth ONCE daily with food as directed on package.     Vitals:    09/09/19 0928 09/09/19 0932   BP: 116/70    BP Source: Arm, Right Upper    Patient Position: Sitting    Pulse: 99    Resp: 18 18   Temp: 36.8 ?C (98.2 ?F)    TempSrc: Oral    SpO2: 100%    Weight: 61 kg (134 lb 6.4 oz) 61  kg (134 lb 6.4 oz)   Height: 161.3 cm (63.5) 161.3 cm (63.5)   PainSc: Zero      Body mass index is 23.43 kg/m?Marland Kitchen     Pain Score: Zero       Fatigue Scale: 0-None    Pain Addressed:  N/A    Patient Evaluated for a Clinical Trial: No treatment clinical trial available for this patient.     [name] Cooperative Oncology Group performance status is 0, Fully active, able to carry on all pre-disease performance without restriction.Marland Kitchen     Physical Exam  Vitals reviewed.   Constitutional:       Appearance: Normal appearance.   HENT:      Head: Normocephalic.   Eyes:      Extraocular Movements: Extraocular movements intact.   Cardiovascular:      Rate and Rhythm: Normal rate.   Pulmonary:      Effort: Pulmonary effort is normal.   Musculoskeletal:         General: Normal range of motion.      Cervical back: Normal range of motion.   Skin:     General: Skin is warm and dry.   Neurological:      General: No focal deficit present.      Mental Status: She is alert and oriented to person, place, and time.   Psychiatric:         Mood and Affect: Mood normal.         Behavior: Behavior normal.         Thought Content: Thought content normal.          Results for orders placed or performed during the hospital encounter of 09/09/19 (from the past 336 hour(s))   CBC AND DIFF   Result Value Ref Range    White Blood Cells 6.1 4.5 - 11.0 K/UL    RBC 3.69 (L) 4.0 - 5.0 M/UL    Hemoglobin 11.9 (L) 12.0 - 15.0 GM/DL    Hematocrit 16.1 36 - 45 %    MCV 99.7 80 - 100 FL    MCH 32.2 26 - 34 PG    MCHC 32.2 32.0 - 36.0 G/DL    RDW 09.6 (H) 11 - 15 %    Platelet Count 193 150 - 400 K/UL    MPV 9.2 7 - 11 FL    Neutrophils 68 41 - 77 %    Lymphocytes 17 (L) 24 - 44 %    Monocytes 13 (H) 4 - 12 %    Eosinophils 1 0 - 5 %    Basophils 1 0 - 2 %    Absolute Neutrophil Count 4.20 1.8 - 7.0 K/UL    Absolute Lymph Count 1.10 1.0 - 4.8 K/UL    Absolute Monocyte Count 0.80 0 - 0.80 K/UL    Absolute Eosinophil Count 0.10 0 - 0.45 K/UL    Absolute Basophil Count 0.00 0 - 0.20 K/UL          Assessment and Plan:       Patient Active Problem List    Diagnosis Date Noted   ? 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  Patient aware to call if bleeding or unusual bruising.  Has been on same medication and is fairly familiar.     ? Dehydration 05/26/2019   ? Chronic  midline thoracic back pain 05/26/2019   ? CINV (chemotherapy-induced nausea and vomiting) 05/26/2019   ? Dizziness 05/26/2019   ? Diarrhea 05/26/2019   ? Parkinson's disease (HCC) 05/11/2019   ? Pancreatic cancer metastasized to liver [facility]) 05/07/2019     04/13/2019 [facility] medical imaging [facility] CT abdomen pelvis with contrast: Abdominal pain constipation unintentional weight loss.  The head of the pancreas is 3 hypodense lesions.  The lesion towards the right lateral side measures 1.8 cm.  Anterior lesion the mild/mid head measures 11.4 cm.  There are 3 right hepatic lesions that are suspicious. Impression: Multifocal pancreas lesion suspicious for tumor such as adenocarcinoma.  Could also be focal inflammation from subacute or chronic pancreatitis.  04/20/2019 [place] medical imaging [facility] MRI abdomen without and with contrast.  Indeterminate lesions in the pancreatic bed and proximal body.  Pancreatic neoplasm cannot be excluded.  This is compared to CT/12/2019.  Several right and left hepatic lesions are present.  The 2 largest are adjacent to 1 another.  Measuring 2.1 x 1.8 cm.  Concerning for hepatic metastatic lesions.  Heterogeneous process pancreatic head and proximal body the whole area measures approximately 5.2 x 3.5 cm.  It appears to be least some encasement of the associated narrowing of the superior mesenteric vein  04/23/2019 [facility] clinic note.  Patient had Covid in November 2020.  Recent imaging showed spots on pancreas.  History of Parkinson's weight 156 pounds  05/07/2019 [place] EUS report examination of the left lobe of the liver revealed 2 lesions measuring 1 cm each.  FNA x4 was performed upper limit result is positive for malignant cells.  Examination of pancreatic revealed a mass in the neck the pancreas with invasion to SMA.  Fine-needle biopsy was obtained and preliminary is all is positive for malignant cells.  Await cytology results and await path results.  05/07/2019 CA 19-9 1782, H 12.3, platelets 120 low, W3.5 low, chemistries normal,  05/07/2019 [place] pathology/cytology liver US-FNA malignant cells present consistent with adenocarcinoma.  Pancreatic neck EUS FNA: Malignant cells present consistent with adenocarcinoma.  Mismatch repair proteins are ordered and will reported in an addendum.  Microsatellite instability test by PCR is also been ordered and will be reported separately.  Part A contain sufficient tissue for further testing.  05/11/2019 this is patient's first visit with [facility] oncology.  Husband Ron participated in person son Trey Paula who is a Land, by phone.  Discussed biopsy results and also stage and prognosis of stage IV pancreatic cancer.  Talked about treatment options including FOLFIRINOX with growth factor, Gemzar and Abraxane, single agent Gemzar, no therapy/palliative care.  Patient is very good performance status.  Would suggest slightly dose reduced FOLFIRINOX with growth factor.  Patient will think about it but is willing to set things in motion for PowerPort placement and chemotherapy and teaching etc.  They plan to move here from [facility] in the near future.  05/14/2019 [place] CT chest multiple tiny bilateral pulmonary nodules suspicious for metastatic disease the likely 2 small for definitive PET/CT characterization or biopsy.  A follow-up chest CT in 2-3 months is suggested to assess for stability.  No thoracic adenopathy.  05/19/19: FOLFIRINOX education  05/20/19 Started modified FOLFIRINOX  05/26/19: Toxicity check: Tolerated first cycle relatively well. Minimal nausea and diarrhea. 1 liter of fluids in clinic today. Having back pain. Recommended trying some ibuprofen, heat and massage, reassess next week with Dr. Madison Hickman.  06/03/2019 cycle cycle 1 day 15 FOLFIRINOX appetite improving  less back pain all very encouraging tumor marker pending lab work looks good.  Proceed with cycle 1 day 15 see nurse in 2 weeks physician in 4 weeks imaging after 2 months  06/16/19  C3 FOLFIRINOX.  Tolerating therapy very well.  Tumor marker continues to decrease.  FU with Dr. Madison Hickman in 2 weeks.    07/08/19  C4 FOLFIRINOX.  Improved appetite.  CT prior to FU with Dr. Georgiann Hahn in 2 weeks.  Note that tumor marker is rising.   07/20/2019 [place] CT CAP: Questionable nonocclusive filling defect in partially visualized right IJ vein.  May be from mixing artifact versus small clot.  No thoracic adenopathy.  No significant change in scattered indeterminate bilateral pulmonary nodules.  No significant change in size of infiltrative pancreatic malignancy since 05/14/2019.  Persistent encasement.  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  Additional small nonocclusive thrombus in the left renal vein.  Dominant right hepatic metastasis are not significantly changed since 05/14/2019.  Smaller scattered hepatic metastasis are decreased in size.  No new abdominal pelvic mass or ascites.  07/21/2019 cycle 5 FOLFIRINOX with Neulasta support.  Platelets 99,000.  Will decrease oxaliplatin further to 50 mg/metered squared.  Discussed clot with patient.  No bleeding reported.  Will begin Xarelto.  Husband is on same medications.  Patient aware and discussed risks and benefits.  Will return in 2 in 4 weeks for additional chemo.  08/11/19  Pt is 1 week later for C6 FOLFIRINOX due to severe diarrhea last week.  Sx have resolved.  She will take Imodium prn.  FU with Dr. Madison Hickman in 2 weeks.    09/09/19  C7 FOLFIRINOX at continued dose reductions for low PLT and diarrhea.  FU in 2 and 4 weeks.  If tumor marker continues to rise, plan to scan in October.        Active therapy FOLFIRINOX with growth factor    Conception Oms

## 2019-09-11 ENCOUNTER — Encounter: Admit: 2019-09-11 | Discharge: 2019-09-11 | Payer: MEDICARE

## 2019-09-11 DIAGNOSIS — C259 Malignant neoplasm of pancreas, unspecified: Secondary | ICD-10-CM

## 2019-09-11 DIAGNOSIS — C787 Secondary malignant neoplasm of liver and intrahepatic bile duct: Secondary | ICD-10-CM

## 2019-09-11 DIAGNOSIS — M549 Dorsalgia, unspecified: Secondary | ICD-10-CM

## 2019-09-11 DIAGNOSIS — G20 Parkinson's disease: Secondary | ICD-10-CM

## 2019-09-11 MED ORDER — HEPARIN, PORCINE (PF) 100 UNIT/ML IV SYRG
500 [IU] | Freq: Once | 0 refills | Status: CP
Start: 2019-09-11 — End: ?

## 2019-09-11 MED ORDER — PEGFILGRASTIM-CBQV 6 MG/0.6 ML SC SYRG
6 mg | Freq: Once | SUBCUTANEOUS | 0 refills | Status: CP
Start: 2019-09-11 — End: ?
  Administered 2019-09-11: 19:00:00 6 mg via SUBCUTANEOUS

## 2019-09-11 NOTE — Progress Notes
D/C pump with no residual.   Patient has no complaints or changes to report.    Central line flushed per protocol.  Patient tolerated Udenyca injection well in abdominal tissue.   Encouraged PO Intake and instructed patient to call our office with any problems before next visit.    Discharged in good condition, ambulatory.

## 2019-09-17 ENCOUNTER — Encounter: Admit: 2019-09-17 | Discharge: 2019-09-17 | Payer: MEDICARE

## 2019-09-17 DIAGNOSIS — I81 Portal vein thrombosis: Secondary | ICD-10-CM

## 2019-09-17 DIAGNOSIS — C259 Malignant neoplasm of pancreas, unspecified: Secondary | ICD-10-CM

## 2019-09-17 MED ORDER — XARELTO DVT-PE TREAT 30D START 15 MG (42)- 20 MG (9) PO DSPK
ORAL_TABLET | Freq: Two times a day (BID) | 0 refills
Start: 2019-09-17 — End: ?

## 2019-09-17 MED ORDER — RIVAROXABAN 20 MG PO TAB
20 mg | ORAL_TABLET | Freq: Every day | ORAL | 1 refills | 30.00000 days | Status: AC
Start: 2019-09-17 — End: ?

## 2019-09-22 ENCOUNTER — Encounter: Admit: 2019-09-22 | Discharge: 2019-09-22 | Payer: MEDICARE

## 2019-09-22 MED ORDER — OXALIPLATIN IVPB
50 mg/m2 | Freq: Once | INTRAVENOUS | 0 refills
Start: 2019-09-22 — End: ?

## 2019-09-22 MED ORDER — ATROPINE 0.4 MG/ML IJ SOLN
0.4 mg | INTRAVENOUS | 0 refills | PRN
Start: 2019-09-22 — End: ?

## 2019-09-22 MED ORDER — DEXAMETHASONE 6 MG PO TAB
12 mg | Freq: Once | ORAL | 0 refills
Start: 2019-09-22 — End: ?

## 2019-09-22 MED ORDER — PALONOSETRON 0.25 MG/5 ML IV SOLN
.25 mg | Freq: Once | INTRAVENOUS | 0 refills
Start: 2019-09-22 — End: ?

## 2019-09-22 MED ORDER — IRINOTECAN IVPB
135 mg/m2 | Freq: Once | INTRAVENOUS | 0 refills
Start: 2019-09-22 — End: ?

## 2019-09-22 MED ORDER — FLUOROURACIL IV AMB PUMP
2160 mg/m2 | Freq: Once | INTRAVENOUS | 0 refills
Start: 2019-09-22 — End: ?

## 2019-09-23 ENCOUNTER — Encounter: Admit: 2019-09-23 | Discharge: 2019-09-23 | Payer: MEDICARE

## 2019-09-23 DIAGNOSIS — R978 Other abnormal tumor markers: Secondary | ICD-10-CM

## 2019-09-23 DIAGNOSIS — G20 Parkinson's disease: Secondary | ICD-10-CM

## 2019-09-23 DIAGNOSIS — C259 Malignant neoplasm of pancreas, unspecified: Secondary | ICD-10-CM

## 2019-09-23 DIAGNOSIS — M549 Dorsalgia, unspecified: Secondary | ICD-10-CM

## 2019-09-23 LAB — CA19.9: Lab: 239 U/mL — ABNORMAL HIGH (ref <35)

## 2019-09-23 MED ORDER — IRINOTECAN IVPB
135 mg/m2 | Freq: Once | INTRAVENOUS | 0 refills | Status: CP
Start: 2019-09-23 — End: ?
  Administered 2019-09-23 (×2): 225.4 mg via INTRAVENOUS

## 2019-09-23 MED ORDER — ATROPINE 0.4 MG/ML IJ SOLN
0.4 mg | INTRAVENOUS | 0 refills | Status: AC | PRN
Start: 2019-09-23 — End: ?
  Administered 2019-09-23: 18:00:00 0.4 mg via INTRAVENOUS

## 2019-09-23 MED ORDER — OXALIPLATIN IVPB
50 mg/m2 | Freq: Once | INTRAVENOUS | 0 refills | Status: CP
Start: 2019-09-23 — End: ?
  Administered 2019-09-23 (×2): 83.5 mg via INTRAVENOUS

## 2019-09-23 MED ORDER — PALONOSETRON 0.25 MG/5 ML IV SOLN
.25 mg | Freq: Once | INTRAVENOUS | 0 refills | Status: CP
Start: 2019-09-23 — End: ?
  Administered 2019-09-23: 15:00:00 0.25 mg via INTRAVENOUS

## 2019-09-23 MED ORDER — DEXAMETHASONE 6 MG PO TAB
12 mg | Freq: Once | ORAL | 0 refills | Status: CP
Start: 2019-09-23 — End: ?

## 2019-09-23 MED ORDER — FLUOROURACIL IV AMB PUMP
2160 mg/m2 | Freq: Once | INTRAVENOUS | 0 refills | Status: CP
Start: 2019-09-23 — End: ?
  Administered 2019-09-23: 20:00:00 3607 mg via INTRAVENOUS

## 2019-09-23 NOTE — Progress Notes
Name: Cassandra Wallace          MRN: [id number]      DOB: 02/27/1945      AGE: 74 y.o.   DATE OF SERVICE: 09/23/2019    Subjective:             Reason for Visit:  Follow Up      Cassandra Wallace is a 74 y.o. female.     Cancer Staging  Pancreatic cancer metastasized to liver [facility])  Staging form: Exocrine Pancreas, AJCC 8th Edition  - Clinical stage from 05/09/2019: Stage IV (cT4, cN0, pM1) - Signed by Lady Deutscher, MD on 05/09/2019      History of Present Illness  Cassandra Wallace is here today in FU of her metastatic pancreatic cancer and C8?FOLFIRINOX chemotherapy.??She has a history of abdominal pain, constipation and unintentional weight loss. ?Unfortunately a CT ?abdomen and pelvis done in [place] showed pancreatic lesions as well as 3 right hepatic lesions. ?An MRI saw the same lesions. ?An EUS performed on May 07, 2019 at [place] found to abnormal lesions in the left lobe of the liver and a pancreatic mass in the neck the pancreas that was invading the superior mesenteric artery. Biopsies of both came back consistent with adenocarcinoma. A CA 19-9 came back as [name] which is elevated.??  ?  --?Started?FOLFIRINOX ?on 05/20/19.  ?  -- ?CT on 07/20/19 showed stable disease. ?Incidental finding of portal vein thrombus. ?Pt started on Xarelto. ?  ?  Interim hx:  No GI upset or other new or concerning issues to report.  Pt tolerating current therapy extremely well.  Denies neuropathy.          Review of Systems   Constitutional: Negative.    HENT: Negative.  Negative for mouth sores.    Respiratory: Negative.    Cardiovascular: Negative.    Gastrointestinal: Negative for diarrhea and nausea.   Genitourinary: Negative.    Musculoskeletal: Negative.    Skin: Negative.    Neurological: Negative.  Negative for numbness.   Hematological: Negative.    Psychiatric/Behavioral: Negative.    All other systems reviewed and are negative.        Objective:         ? carbidopa/levodopa (SINEMET) 25/100 mg tablet Take 1 tablet by mouth three times daily.   ? lidocaine/prilocaine (EMLA) 2.5/2.5 % topical cream Apply a thin film over port one hour prior to treatment. Do not rub in. Cover with plastic wrap after application   ? loperamide (IMODIUM A-D) 2 mg capsule Take 2 capsules by mouth after first loose/frequent bowel movement, then 1 capsule every 2 hours (2 capsules every 4 hours at night) until 12 hours have passed without a bowel movement.   ? ondansetron (ZOFRAN) 8 mg tablet Take one tablet by mouth every 8 hours as needed for Nausea.   ? rivaroxaban (XARELTO) 20 mg tablet Take one tablet by mouth daily. Take with food.     There were no vitals filed for this visit.  There is no height or weight on file to calculate BMI.                  Pain Addressed:  N/A    Patient Evaluated for a Clinical Trial: No treatment clinical trial available for this patient.     [name] Cooperative Oncology Group performance status is 0, Fully active, able to carry on all pre-disease performance without restriction.Marland Kitchen     Physical Exam  Vitals reviewed.  Constitutional:       Appearance: Normal appearance.   HENT:      Head: Normocephalic.   Eyes:      Extraocular Movements: Extraocular movements intact.   Cardiovascular:      Rate and Rhythm: Normal rate.   Pulmonary:      Effort: Pulmonary effort is normal.   Musculoskeletal:         General: Normal range of motion.      Cervical back: Normal range of motion.   Skin:     General: Skin is warm and dry.   Neurological:      General: No focal deficit present.      Mental Status: She is alert and oriented to person, place, and time.   Psychiatric:         Mood and Affect: Mood normal.         Behavior: Behavior normal.         Thought Content: Thought content normal.          Results for orders placed or performed during the hospital encounter of 09/23/19 (from the past 336 hour(s))   CBC AND DIFF   Result Value Ref Range    White Blood Cells 6.7 4.5 - 11.0 K/UL    RBC 3.65 (L) 4.0 - 5.0 M/UL    Hemoglobin 11.8 (L) 12.0 - 15.0 GM/DL    Hematocrit 16.1 36 - 45 %    MCV 99.4 80 - 100 FL    MCH 32.4 26 - 34 PG    MCHC 32.6 32.0 - 36.0 G/DL    RDW 09.6 (H) 11 - 15 %    Platelet Count 158 150 - 400 K/UL    MPV 9.2 7 - 11 FL    Neutrophils 70 41 - 77 %    Lymphocytes 17 (L) 24 - 44 %    Monocytes 10 4 - 12 %    Eosinophils 2 0 - 5 %    Basophils 1 0 - 2 %    Absolute Neutrophil Count 4.70 1.8 - 7.0 K/UL    Absolute Lymph Count 1.10 1.0 - 4.8 K/UL    Absolute Monocyte Count 0.70 0 - 0.80 K/UL    Absolute Eosinophil Count 0.10 0 - 0.45 K/UL    Absolute Basophil Count 0.10 0 - 0.20 K/UL          Assessment and Plan:       Patient Active Problem List    Diagnosis Date Noted   ? 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  Patient aware to call if bleeding or unusual bruising.  Has been on same medication and is fairly familiar.     ? Dehydration 05/26/2019   ? 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 CINV (chemotherapy-induced nausea and vomiting) 05/26/2019   ? Dizziness 05/26/2019   ? Diarrhea 05/26/2019   ? Parkinson's disease (HCC) 05/11/2019   ? Pancreatic cancer metastasized to liver [facility]) 05/07/2019     04/13/2019 [facility] medical imaging [facility] CT abdomen pelvis with contrast: Abdominal pain constipation unintentional weight loss.  The head of the pancreas is 3 hypodense lesions.  The lesion towards the right lateral side measures 1.8 cm.  Anterior lesion the mild/mid head measures 11.4 cm.  There are 3 right hepatic lesions that are suspicious.  Impression:  Multifocal pancreas lesion suspicious for tumor such as adenocarcinoma.  Could also be focal inflammation from subacute or chronic pancreatitis.  04/20/2019 [facility] medical imaging [facility] MRI abdomen without and with contrast.  Indeterminate lesions in the pancreatic bed and proximal body.  Pancreatic neoplasm cannot be excluded. This is compared to CT/12/2019.  Several right and left hepatic lesions are present.  The 2 largest are adjacent to 1 another.  Measuring 2.1 x 1.8 cm.  Concerning for hepatic metastatic lesions.  Heterogeneous process pancreatic head and proximal body the whole area measures approximately 5.2 x 3.5 cm.  It appears to be least some encasement of the associated narrowing of the superior mesenteric vein  04/23/2019 [facility] clinic note.  Patient had Covid in November 2020.  Recent imaging showed spots on pancreas.  History of Parkinson's weight 156 pounds  05/07/2019 [place] EUS report examination of the left lobe of the liver revealed 2 lesions measuring 1 cm each.  FNA x4 was performed upper limit result is positive for malignant cells.  Examination of pancreatic revealed a mass in the neck the pancreas with invasion to SMA.  Fine-needle biopsy was obtained and preliminary is all is positive for malignant cells.  Await cytology results and await path results.  05/07/2019 CA 19-9 1782, H 12.3, platelets 120 low, W3.5 low, chemistries normal,  05/07/2019 [place] pathology/cytology liver US-FNA malignant cells present consistent with adenocarcinoma.  Pancreatic neck EUS FNA: Malignant cells present consistent with adenocarcinoma.  Mismatch repair proteins are ordered and will reported in an addendum.  Microsatellite instability test by PCR is also been ordered and will be reported separately.  Part A contain sufficient tissue for further testing.  05/11/2019 this is patient's first visit with [facility].  Husband Cassandra Wallace participated in person son Cassandra Wallace who is a [employer], by phone.  Discussed biopsy results and also stage and prognosis of stage IV pancreatic cancer.  Talked about treatment options including FOLFIRINOX with growth factor, Gemzar and Abraxane, single agent Gemzar, no therapy/palliative care.  Patient is very good performance status.  Would suggest slightly dose reduced FOLFIRINOX with growth factor.  Patient will think about it but is willing to set things in motion for PowerPort placement and chemotherapy and teaching etc.  They plan to move here from [facility] in the near future.  05/14/2019 [place] CT chest multiple tiny bilateral pulmonary nodules suspicious for metastatic disease the likely 2 small for definitive PET/CT characterization or biopsy.  A follow-up chest CT in 2-3 months is suggested to assess for stability.  No thoracic adenopathy.  05/19/19: FOLFIRINOX education  05/20/19 Started modified FOLFIRINOX  05/26/19: Toxicity check: Tolerated first cycle relatively well. Minimal nausea and diarrhea. 1 liter of fluids in clinic today. Having back pain. Recommended trying some ibuprofen, heat and massage, reassess next week with Dr. Madison Hickman.  06/03/2019 cycle cycle 1 day 15 FOLFIRINOX appetite improving less back pain all very encouraging tumor marker pending lab work looks good.  Proceed with cycle 1 day 15 see nurse in 2 weeks physician in 4 weeks imaging after 2 months  06/16/19  C3 FOLFIRINOX.  Tolerating therapy very well.  Tumor marker continues to decrease.  FU with Dr. Madison Hickman in 2 weeks.    07/08/19  C4 FOLFIRINOX.  Improved appetite.  CT prior to FU with Dr. Georgiann Hahn in 2 weeks.  Note that tumor marker is rising.   07/20/2019 [place] CT CAP: Questionable nonocclusive filling defect in partially visualized right IJ vein.  May be  from mixing artifact versus small clot.  No thoracic adenopathy.  No significant change in scattered indeterminate bilateral pulmonary nodules.  No significant change in size of infiltrative pancreatic malignancy since 05/14/2019.  Persistent encasement.  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  Additional small nonocclusive thrombus in the left renal vein.  Dominant right hepatic metastasis are not significantly changed since 05/14/2019.  Smaller scattered hepatic metastasis are decreased in size.  No new abdominal pelvic mass or ascites.  07/21/2019 cycle 5 FOLFIRINOX with Neulasta support.  Platelets 99,000.  Will decrease oxaliplatin further to 50 mg/metered squared.  Discussed clot with patient.  No bleeding reported.  Will begin Xarelto.  Husband is on same medications.  Patient aware and discussed risks and benefits.  Will return in 2 in 4 weeks for additional chemo.  08/11/19  Pt is 1 week later for C6 FOLFIRINOX due to severe diarrhea last week.  Sx have resolved.  She will take Imodium prn.  FU with Dr. Madison Hickman in 2 weeks.    09/09/19  C7 FOLFIRINOX at continued dose reductions for low PLT and diarrhea.  FU in 2 and 4 weeks.  If tumor marker continues to rise, plan to scan in October.    09/23/19  C8 FOLFIRINOX.  Pt tolerating chemo exceptionally well.  Tumor marker is rising so will order CT CAP prior to FU with Dr. Madison Hickman in 2 weeks.        Active therapy FOLFIRINOX with growth factor    Conception Oms

## 2019-09-23 NOTE — Progress Notes
Port accessed and labs drawn.   Pt was seen by Lawanna Kobus APRN for an office visit. Pt denies complaints at this time.   Parameters met and pt ok to treat today. Plan given as ordered.   Port was connected to ambulatory pump. Prior education re-enforced. Pt states understanding. Pump double checked and connections secure.   Discharged in good condition, ambulatory.       CHEMO NOTE  Verified chemo consent signed and in chart.    Verified initiate chemo order in O2    Blood return positive via: Port (Single)    BSA and dose double checked (agree with orders as written) with: yes     Labs/applicable tests checked: CBC and CMP    Chemo regime: Drug Folfirinox /cycle 8 /day 1.    Rate verified and armband double checkwith second RN: yes    Patient education offered and stated understanding. Denies questions at this time.

## 2019-09-25 ENCOUNTER — Encounter: Admit: 2019-09-25 | Discharge: 2019-09-25 | Payer: MEDICARE

## 2019-09-25 DIAGNOSIS — M549 Dorsalgia, unspecified: Secondary | ICD-10-CM

## 2019-09-25 DIAGNOSIS — C259 Malignant neoplasm of pancreas, unspecified: Secondary | ICD-10-CM

## 2019-09-25 DIAGNOSIS — G20 Parkinson's disease: Secondary | ICD-10-CM

## 2019-09-25 MED ORDER — HEPARIN, PORCINE (PF) 100 UNIT/ML IV SYRG
500 [IU] | Freq: Once | 0 refills | Status: CP
Start: 2019-09-25 — End: ?

## 2019-09-25 MED ORDER — PEGFILGRASTIM-CBQV 6 MG/0.6 ML SC SYRG
6 mg | Freq: Once | SUBCUTANEOUS | 0 refills | Status: CP
Start: 2019-09-25 — End: ?
  Administered 2019-09-25: 19:00:00 6 mg via SUBCUTANEOUS

## 2019-09-25 NOTE — Progress Notes
Patient has no complaints or changes to report. Pump empty and discontinued.  Port accessed, labs collected.  Port de-accessed per protocol, heparinized.   Udenyca injection given per plan.   Discharged in good condition, ambulatory.

## 2019-10-05 ENCOUNTER — Ambulatory Visit: Admit: 2019-10-05 | Discharge: 2019-10-05 | Payer: MEDICARE

## 2019-10-05 ENCOUNTER — Encounter: Admit: 2019-10-05 | Discharge: 2019-10-05 | Payer: MEDICARE

## 2019-10-05 DIAGNOSIS — R978 Other abnormal tumor markers: Secondary | ICD-10-CM

## 2019-10-05 DIAGNOSIS — C259 Malignant neoplasm of pancreas, unspecified: Secondary | ICD-10-CM

## 2019-10-05 MED ORDER — SODIUM CHLORIDE 0.9 % IJ SOLN
50 mL | Freq: Once | INTRAVENOUS | 0 refills | Status: CP
Start: 2019-10-05 — End: ?
  Administered 2019-10-05: 15:00:00 50 mL via INTRAVENOUS

## 2019-10-05 MED ORDER — HEPARIN, PORCINE (PF) 100 UNIT/ML IV SYRG
500 [IU] | Freq: Once | 0 refills | Status: CP
Start: 2019-10-05 — End: ?

## 2019-10-05 MED ORDER — IOHEXOL 350 MG IODINE/ML IV SOLN
100 mL | Freq: Once | INTRAVENOUS | 0 refills | Status: CP
Start: 2019-10-05 — End: ?

## 2019-10-06 ENCOUNTER — Encounter: Admit: 2019-10-06 | Discharge: 2019-10-06 | Payer: MEDICARE

## 2019-10-06 DIAGNOSIS — C259 Malignant neoplasm of pancreas, unspecified: Secondary | ICD-10-CM

## 2019-10-06 DIAGNOSIS — G20 Parkinson's disease: Secondary | ICD-10-CM

## 2019-10-06 DIAGNOSIS — R978 Other abnormal tumor markers: Secondary | ICD-10-CM

## 2019-10-06 DIAGNOSIS — M549 Dorsalgia, unspecified: Secondary | ICD-10-CM

## 2019-10-06 MED ORDER — ONDANSETRON 8 MG PO TBDI
16 mg | Freq: Once | ORAL | 0 refills
Start: 2019-10-06 — End: ?

## 2019-10-06 MED ORDER — GEMCITABINE IVPB
1000 mg/m2 | Freq: Once | INTRAVENOUS | 0 refills
Start: 2019-10-06 — End: ?

## 2019-10-06 MED ORDER — PACLITAXEL PBP IVPB
125 mg/m2 | Freq: Once | INTRAVENOUS | 0 refills
Start: 2019-10-06 — End: ?

## 2019-10-06 NOTE — Progress Notes
Name: Cassandra Wallace          MRN: [id number]      DOB: 1945/02/17      AGE: 74 y.o.   DATE OF SERVICE: 10/06/2019    Subjective:             Reason for Visit:  Follow Up      Cassandra Wallace is a 74 y.o. female.     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  Unfortunately a CT of the abdomen and pelvis done in [place] showed pancreatic lesions as well as 3 right hepatic lesions.  An MRI saw the same lesions.  An E[facility] performed on May 07, 2019 at [facility] unfortunately found to abnormal lesions in the left lobe of the liver and a pancreatic mass in the neck the pancreas was invading the superior mesenteric artery.  Biopsies of both came back consistent with adenocarcinoma.  A CA 19-9 came back as [name] which is elevated.  Her chemistries were normal.  After long discussion the patient began FOLFIRINOX chemotherapy growth factor support on about May 20, 2019.  The patient had a scan on 07/20/2019 showing stable and other lesions that are decreased in size.  It also showed portal vein thrombus. Unfortunately recent scan on 10/05/2019 showed progressive disease. We do note that the clot previously seen was smaller/resolved.    I reviewed the scan with the patient. Unfortunately it shows progressive and new lesions. I suggested we stop the FOLFIRINOX chemotherapy and instead make plans for Gemzar and Abraxane. I explained that the response rate is price or between 20 and 40%. After the patient left I also realized we had not done BRCA/PALB/NT RK/MSI testing. We will see if we can arrange this to foundation 1 liquid CDX.    The patient has no new symptoms. She does have fatigue. Minimal to no neuropathy. Occasional diarrhea well controlled.    Amzie is a very supportive family including her husband Ron and her son Trey Paula, who is a Land here in town.  I believe there are other children in the family.    Ron was a Best boy through [employer] across [place].  He mostly worked in [place].  His name is Czechoslov[place]ian/Bohemian.  We talked about ethnic foods.  Cassandra Wallace on the other hand is originally [place] and her ethnicity.  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    In addition to the history of metastatic pancreatic cancer diagnosis and portal vein thrombosis, the patient also has a history of Parkinson's though she has been very stable.    The patient is fully vaccinated regarding the Covid virus.    We will make plans for Gemzar and Abraxane. We will also see and check into the foundation 1 liquid CDX testing.           Review of Systems   Constitutional:  Positive for fatigue. Negative for chills and fever.   HENT: Negative for nosebleeds.    Eyes: Negative for pain and visual disturbance.   Respiratory: Negative for cough and shortness of breath.    Cardiovascular: Negative for chest pain and leg swelling.   Gastrointestinal: Negative for abdominal pain.   Genitourinary: Negative for hematuria.   Musculoskeletal: Negative for joint swelling and neck pain.   Skin: Negative for rash.   Neurological: Negative for dizziness and weakness.   Hematological: Negative for adenopathy.   Psychiatric/Behavioral: Negative for confusion.   All other systems reviewed and are negative.        Objective:           There were no vitals filed for this visit.  There is no height or weight on file to calculate BMI.     Medical History:   Diagnosis Date   ? Back pain    ? Pancreatic cancer (HCC)     mets to liver   ? Parkinson's disease (HCC) 2016     Surgical History:   Procedure Laterality Date   ? CATARACT REMOVAL Bilateral 2020   ? ESOPHAGOGASTRODUODENOSCOPY WITH TRANSENDOSCOPIC ULTRASOUND GUIDED FINE NEEDLE ASPIRATION/ BIOPSY N/A 05/07/2019    Performed by Comer Locket, MD at [facility] ENDO      Social History     Tobacco Use   ? Smoking status: Never Smoker   ? Smokeless tobacco: Never Used   Vaping Use   ? Vaping Use: Never used   Substance Use Topics   ? Alcohol use: Not Currently   ? Drug use: Never     Family History   Problem Relation Age of Onset   ? Cancer-Uterine Mother    ? Arthritis-rheumatoid Father                       Pain Addressed:  N/A    Patient Evaluated for a Clinical Trial: No treatment clinical trial available for this patient.     [name] Cooperative Oncology Group performance status is 1, Restricted in physically strenuous activity but ambulatory and able to carry out work of a light or sedentary nature, e.g., light house work, office work.     Physical Exam  Vitals reviewed.   Constitutional:       Appearance: She is well-developed.   HENT:      Head: Normocephalic and atraumatic.   Eyes:      Conjunctiva/sclera: Conjunctivae normal.   Neck:      Vascular: No JVD.      Trachea: No tracheal deviation.   Cardiovascular:      Rate and Rhythm: Normal rate and regular rhythm.      Heart sounds: No friction rub. No gallop.    Pulmonary:      Effort: Pulmonary effort is normal. No respiratory distress.      Breath sounds: Normal breath sounds. No stridor. No wheezing or rales.   Abdominal:      General: There is no distension.      Palpations: Abdomen is soft. There is no mass.      Tenderness: There is no abdominal tenderness.   Musculoskeletal:         General: Normal range of motion.      Cervical back: Normal range of motion.   Lymphadenopathy:      Head:      Right side of head: No submental or submandibular adenopathy.      Left  side of head: No submental or submandibular adenopathy.      Cervical: No cervical adenopathy.      Right cervical: No superficial or posterior cervical adenopathy.     Left cervical: No superficial or posterior cervical adenopathy.      Upper Body:      Right upper body: No supraclavicular or epitrochlear adenopathy.      Left upper body: No supraclavicular or epitrochlear adenopathy.   Skin:     General: Skin is warm and dry.      Findings: No rash.   Neurological:      Mental Status: She is alert and oriented to person, place, and time.      Cranial Nerves: No cranial nerve deficit.   Psychiatric:         Behavior: Behavior normal.         Thought Content: Thought content normal.         Judgment: Judgment normal.               Assessment and Plan:  Patient Active Problem List    Diagnosis Date Noted   ? Pancreatic cancer metastasized to liver [facility]) 05/07/2019     Priority: Medium     04/13/2019 [facility] medical imaging [facility] CT abdomen pelvis with contrast: Abdominal pain constipation unintentional weight loss.  The head of the pancreas is 3 hypodense lesions.  The lesion towards the right lateral side measures 1.8 cm.  Anterior lesion the mild/mid head measures 11.4 cm.  There are 3 right hepatic lesions that are suspicious.  Impression: Multifocal pancreas lesion suspicious for tumor such as adenocarcinoma.  Could also be focal inflammation from subacute or chronic pancreatitis.  04/20/2019 [place] medical imaging [facility] MRI abdomen without and with contrast.  Indeterminate lesions in the pancreatic bed and proximal body.  Pancreatic neoplasm cannot be excluded.  This is compared to CT/12/2019.  Several right and left hepatic lesions are present.  The 2 largest are adjacent to 1 another.  Measuring 2.1 x 1.8 cm.  Concerning for hepatic metastatic lesions.  Heterogeneous process pancreatic head and proximal body the whole area measures approximately 5.2 x 3.5 cm.  It appears to be least some encasement of the associated narrowing of the superior mesenteric vein  04/23/2019 [facility] clinic note.  Patient had Covid in November 2020.  Recent imaging showed spots on pancreas.  History of Parkinson's weight 156 pounds  05/07/2019 [place] EUS report examination of the left lobe of the liver revealed 2 lesions measuring 1 cm each.  FNA x4 was performed upper limit result is positive for malignant cells.  Examination of pancreatic revealed a mass in the neck the pancreas with invasion to SMA.  Fine-needle biopsy was obtained and preliminary is all is positive for malignant cells.  Await cytology results and await path results.  05/07/2019 CA 19-9 1782, H 12.3, platelets 120 low, W3.5 low, chemistries normal,  05/07/2019 [place] pathology/cytology liver US-FNA malignant cells present consistent with adenocarcinoma.  Pancreatic neck EUS FNA: Malignant cells present consistent with adenocarcinoma.  Mismatch repair proteins are ordered and will reported in an addendum.  Microsatellite instability test by PCR is also been ordered and will be reported separately.  Part A contain sufficient tissue for further testing.  05/11/2019 this is patient's first visit with [facility] oncology.  Husband Ron participated in person son Trey Paula who is a Land, by phone.  Discussed biopsy results and also stage and prognosis of stage IV pancreatic  cancer.  Talked about treatment options including FOLFIRINOX with growth factor, Gemzar and Abraxane, single agent Gemzar, no therapy/palliative care.  Patient is very good performance status.  Would suggest slightly dose reduced FOLFIRINOX with growth factor.  Patient will think about it but is willing to set things in motion for [employer] placement and chemotherapy and teaching etc.  They plan to move here from [facility] in the near future.  05/14/2019 [place] CT chest multiple tiny bilateral pulmonary nodules suspicious for metastatic disease the likely 2 small for definitive PET/CT characterization or biopsy.  A follow-up chest CT in 2-3 months is suggested to assess for stability.  No thoracic adenopathy.  05/19/19: FOLFIRINOX education  05/20/19 Started modified FOLFIRINOX  05/26/19: Toxicity check: Tolerated first cycle relatively well. Minimal nausea and diarrhea. 1 liter of fluids in clinic today. Having back pain. Recommended trying some ibuprofen, heat and massage, reassess next week with Dr. Madison Hickman.  06/03/2019 cycle cycle 1 day 15 FOLFIRINOX appetite improving less back pain all very encouraging tumor marker pending lab work looks good.  Proceed with cycle 1 day 15 see nurse in 2 weeks physician in 4 weeks imaging after 2 months  06/16/19  C3 FOLFIRINOX.  Tolerating therapy very well.  Tumor marker continues to decrease.  FU with Dr. Madison Hickman in 2 weeks.    07/08/19  C4 FOLFIRINOX.  Improved appetite.  CT prior to FU with Dr. Georgiann Hahn in 2 weeks.  Note that tumor marker is rising.   07/20/2019 [place] CT CAP: Questionable nonocclusive filling defect in partially visualized right IJ vein.  May be from mixing artifact versus small clot.  No thoracic adenopathy.  No significant change in scattered indeterminate bilateral pulmonary nodules.  No significant change in size of infiltrative pancreatic malignancy since 05/14/2019.  Persistent encasement.  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  Additional small nonocclusive thrombus in the left renal vein.  Dominant right hepatic metastasis are not significantly changed since 05/14/2019.  Smaller scattered hepatic metastasis are decreased in size.  No new abdominal pelvic mass or ascites.  07/21/2019 cycle 5 FOLFIRINOX with Neulasta support.  Platelets 99,000.  Will decrease oxaliplatin further to 50 mg/metered squared.  Discussed clot with patient.  No bleeding reported.  Will begin Xarelto.  Husband is on same medications.  Patient aware and discussed risks and benefits.  Will return in 2 in 4 weeks for additional chemo.  08/11/19  Pt is 1 week later for C6 FOLFIRINOX due to severe diarrhea last week.  Sx have resolved.  She will take Imodium prn.  FU with Dr. Madison Hickman in 2 weeks.    09/09/19  C7 FOLFIRINOX at continued dose reductions for low PLT and diarrhea.  FU in 2 and 4 weeks.  If tumor marker continues to rise, plan to scan in October.    09/23/19  C8 FOLFIRINOX.  Pt tolerating chemo exceptionally well.  Tumor marker is rising so will order CT CAP prior to FU with Dr. Madison Hickman in 2 weeks.    10/05/2019 [place] CT CAP: No thoracic adenopathy.  No appreciable change in indeterminate bilateral pulmonary nodules.  Slight interval decrease in size of pancreatic neoplasm.  Multifocal hepatic metastasis are overall increased in size and number from prior with one met decrease in size.  Persistent encasement and narrowing of the portal confluence with interval decrease in nonocclusive thrombus in the main portal vein  and resolution of thrombus in the SMV.  No new abdominal pelvic lymphadenopathy or ascites.,,,,,, consider change to Gemzar Abraxane  10/06/2019 reviewed scans with patient which unfortunately show progressive disease. Talk about changing to Gemzar and Abraxane. After patient left realized we had not tested for mutations in BRCA1/PALB/NTRK/MSI stability. We will check into foundation 1 liquid CDX testing.      Active therapy Gemzar and Abraxane    [place]       ? 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  Patient aware to call if bleeding or unusual bruising.  Has been on same medication and is fairly familiar.     ? Dehydration 05/26/2019   ? 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 CINV (chemotherapy-induced nausea and vomiting) 05/26/2019   ? Dizziness 05/26/2019   ? Diarrhea 05/26/2019   ? Parkinson's disease (HCC) 05/11/2019     This note was created with partial dictation using a dragon dictation system, please notify us if you notice any errors of omissions or content.                ? carbidopa/levodopa (SINEMET) 25/100 mg tablet Take 1 tablet by mouth three times daily.   ? lidocaine/prilocaine (EMLA) 2.5/2.5 % topical cream Apply a thin film over port one hour prior to treatment. Do not rub in. Cover with plastic wrap after application   ? loperamide (IMODIUM A-D) 2 mg capsule Take 2 capsules by mouth after first loose/frequent bowel movement, then 1 capsule every 2 hours (2 capsules every 4 hours at night) until 12 hours have passed without a bowel movement.   ? ondansetron (ZOFRAN) 8 mg tablet Take one tablet by mouth every 8 hours as needed for Nausea.   ? rivaroxaban (XARELTO) 20 mg tablet Take one tablet by mouth daily. Take with food.

## 2019-10-07 LAB — CA19.9: Lab: 299 U/mL — ABNORMAL HIGH (ref <35)

## 2019-10-12 ENCOUNTER — Encounter: Admit: 2019-10-12 | Discharge: 2019-10-12 | Payer: MEDICARE

## 2019-10-12 DIAGNOSIS — C259 Malignant neoplasm of pancreas, unspecified: Secondary | ICD-10-CM

## 2019-10-12 DIAGNOSIS — M549 Dorsalgia, unspecified: Secondary | ICD-10-CM

## 2019-10-12 DIAGNOSIS — G20 Parkinson's disease: Secondary | ICD-10-CM

## 2019-10-12 MED ORDER — GEMCITABINE IVPB
1000 mg/m2 | Freq: Once | INTRAVENOUS | 0 refills
Start: 2019-10-12 — End: ?

## 2019-10-12 MED ORDER — ONDANSETRON HCL 8 MG PO TAB
8 mg | ORAL_TABLET | ORAL | 3 refills | 8.00000 days | Status: AC | PRN
Start: 2019-10-12 — End: ?

## 2019-10-12 MED ORDER — COVID-19 VACC,MRNA(MODERNA)-PF 100 MCG/0.5 ML IM SUSP
100 ug | Freq: Once | INTRAMUSCULAR | 0 refills | Status: CN
Start: 2019-10-12 — End: ?

## 2019-10-12 MED ORDER — PACLITAXEL PBP IVPB
125 mg/m2 | Freq: Once | INTRAVENOUS | 0 refills
Start: 2019-10-12 — End: ?

## 2019-10-12 MED ORDER — ONDANSETRON 8 MG PO TBDI
16 mg | Freq: Once | ORAL | 0 refills
Start: 2019-10-12 — End: ?

## 2019-10-12 MED ORDER — HEPARIN, PORCINE (PF) 100 UNIT/ML IV SYRG
500 [IU] | Freq: Once | 0 refills | Status: CP
Start: 2019-10-12 — End: ?

## 2019-10-12 MED ORDER — COVID-19 VACC,MRNA(MODERNA)-PF 100 MCG/0.5 ML IM SUSP
100 ug | Freq: Once | INTRAMUSCULAR | 0 refills | Status: CP
Start: 2019-10-12 — End: ?

## 2019-10-12 NOTE — Progress Notes
Pt had her Moderna 3rd Booster injection. Consent signed and EUA form given to pt.  Pt's vaccine card updated.   Pt monitored for 15 minutes post infusion.   Discharged in good condition, ambulatory.

## 2019-10-12 NOTE — Patient Instructions
Call Immediately to report the following:  Uncontrolled nausea and/or vomiting, uncontrolled pain, or unusual bleeding.  Temperature of 100.4 F or greater and/or any sign/symptom of infection (redness, warmth, tenderness)  Painful mouth or difficulty swallowing  Red, cracked, or painful hands and/or feet  Diarrhea   Swelling of arms or legs  Rash    Important Phone Numbers:  [facility] Main Number (answered 24 hours a day) [phone]  Cancer Center Scheduling (appointments) [phone] OR [phone]  [facility] (for nutritional supplements) [phone]        Port Maintenance - If you have a port, it should be flushed every 6-8 weeks when not in use.  Please check with your MD, nurse, or the scheduler.

## 2019-10-12 NOTE — Progress Notes
Name: Cassandra Wallace          MRN: [id number]      DOB: 03-18-1945      AGE: 74 y.o.   DATE OF SERVICE: 10/12/2019    Subjective:             Reason for Visit:  Pt Ed      Cassandra Wallace is a 74 y.o. female.     Cancer Staging  Pancreatic cancer metastasized to liver [facility])  Staging form: Exocrine Pancreas, AJCC 8th Edition  - Clinical stage from 05/09/2019: Stage IV (cT4, cN0, pM1) - Signed by Lady Deutscher, MD on 05/09/2019      History of Present Illness  Cassandra Wallace is here today for [facility] education.  She has metastatic pancreatic cancer and recently progressed on FOLFIRINOX chemotherapy.??She has a history of abdominal pain, constipation and unintentional weight loss. ?Unfortunately a CT ?abdomen and pelvis done in [place] showed pancreatic lesions as well as 3 right hepatic lesions. ?An MRI saw the same lesions. ?An EUS performed on May 07, 2019 at [place] found to abnormal lesions in the left lobe of the liver and a pancreatic mass in the neck the pancreas that was invading the superior mesenteric artery. Biopsies of both came back consistent with adenocarcinoma. A CA 19-9 came back as [name] which is elevated.??  ?  --?Started?FOLFIRINOX ?on 05/20/19.  ?  -- CT on 07/20/19 showed stable disease. ?Incidental finding of portal vein thrombus. ?Pt started on Xarelto. ?    -- CT on 10/05/19 saw several new and enlarging liver lesions.    ?       Review of Systems   All other systems reviewed and are negative.        Objective:         ? carbidopa/levodopa (SINEMET) 25/100 mg tablet Take 1 tablet by mouth three times daily.   ? lidocaine/prilocaine (EMLA) 2.5/2.5 % topical cream Apply a thin film over port one hour prior to treatment. Do not rub in. Cover with plastic wrap after application   ? loperamide (IMODIUM A-D) 2 mg capsule Take 2 capsules by mouth after first loose/frequent bowel movement, then 1 capsule every 2 hours (2 capsules every 4 hours at night) until 12 hours have passed without a bowel movement. ? ondansetron (ZOFRAN) 8 mg tablet Take one tablet by mouth every 8 hours as needed for Nausea.   ? rivaroxaban (XARELTO) 20 mg tablet Take one tablet by mouth daily. Take with food.     There were no vitals filed for this visit.  There is no height or weight on file to calculate BMI.                  Pain Addressed:  N/A    Patient Evaluated for a Clinical Trial: No treatment clinical trial available for this patient.     [name] Cooperative Oncology Group performance status is 0, Fully active, able to carry on all pre-disease performance without restriction.Marland Kitchen     Physical Exam  Vitals reviewed.   Constitutional:       Appearance: Normal appearance.   HENT:      Head: Normocephalic.   Eyes:      Extraocular Movements: Extraocular movements intact.   Cardiovascular:      Rate and Rhythm: Normal rate.   Pulmonary:      Effort: Pulmonary effort is normal.   Musculoskeletal:         General: Normal range  of motion.      Cervical back: Normal range of motion.   Skin:     General: Skin is warm and dry.   Neurological:      General: No focal deficit present.      Mental Status: She is alert and oriented to person, place, and time.   Psychiatric:         Mood and Affect: Mood normal.         Behavior: Behavior normal.         Thought Content: Thought content normal.          Results for orders placed or performed during the hospital encounter of 10/12/19 (from the past 336 hour(s))   CBC AND DIFF   Result Value Ref Range    White Blood Cells 9.6 4.5 - 11.0 K/UL    RBC 3.61 (L) 4.0 - 5.0 M/UL    Hemoglobin 11.4 (L) 12.0 - 15.0 GM/DL    Hematocrit 16.1 (L) 36 - 45 %    MCV 98.5 80 - 100 FL    MCH 31.7 26 - 34 PG    MCHC 32.2 32.0 - 36.0 G/DL    RDW 09.6 (H) 11 - 15 %    Platelet Count 161 150 - 400 K/UL    MPV 8.8 7 - 11 FL    Neutrophils 74 41 - 77 %    Lymphocytes 13 (L) 24 - 44 %    Monocytes 12 4 - 12 %    Eosinophils 0 0 - 5 %    Basophils 1 0 - 2 %    Absolute Neutrophil Count 7.20 (H) 1.8 - 7.0 K/UL    Absolute Lymph Count 1.20 1.0 - 4.8 K/UL    Absolute Monocyte Count 1.10 (H) 0 - 0.80 K/UL    Absolute Eosinophil Count 0.00 0 - 0.45 K/UL    Absolute Basophil Count 0.00 0 - 0.20 K/UL          Assessment and Plan:       Patient Active Problem List    Diagnosis Date Noted   ? 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  Patient aware to call if bleeding or unusual bruising.  Has been on same medication and is fairly familiar.     ? Dehydration 05/26/2019   ? 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 CINV (chemotherapy-induced nausea and vomiting) 05/26/2019   ? Dizziness 05/26/2019   ? Diarrhea 05/26/2019   ? Parkinson's disease (HCC) 05/11/2019   ? Pancreatic cancer metastasized to liver [facility]) 05/07/2019     04/13/2019 [facility] medical imaging [facility] CT abdomen pelvis with contrast: Abdominal pain constipation unintentional weight loss.  The head of the pancreas is 3 hypodense lesions.  The lesion towards the right lateral side measures 1.8 cm.  Anterior lesion the mild/mid head measures 11.4 cm.  There are 3 right hepatic lesions that are suspicious.  Impression: Multifocal pancreas lesion suspicious for tumor such as adenocarcinoma.  Could also be focal inflammation from subacute or chronic pancreatitis.  04/20/2019 [place] medical imaging [facility] MRI abdomen without and with contrast.  Indeterminate lesions in the pancreatic bed and proximal body.  Pancreatic neoplasm cannot be excluded.  This is compared to CT/12/2019.  Several right and left hepatic lesions are present.  The 2 largest are adjacent  to 1 another.  Measuring 2.1 x 1.8 cm.  Concerning for hepatic metastatic lesions.  Heterogeneous process pancreatic head and proximal body the whole area measures approximately 5.2 x 3.5 cm.  It appears to be least some encasement of the associated narrowing of the superior mesenteric vein  04/23/2019 [facility] clinic note.  Patient had Covid in November 2020.  Recent imaging showed spots on pancreas.  History of Parkinson's weight 156 pounds  05/07/2019 [place] EUS report examination of the left lobe of the liver revealed 2 lesions measuring 1 cm each.  FNA x4 was performed upper limit result is positive for malignant cells.  Examination of pancreatic revealed a mass in the neck the pancreas with invasion to SMA.  Fine-needle biopsy was obtained and preliminary is all is positive for malignant cells.  Await cytology results and await path results.  05/07/2019 CA 19-9 1782, H 12.3, platelets 120 low, W3.5 low, chemistries normal,  05/07/2019 [place] pathology/cytology liver US-FNA malignant cells present consistent with adenocarcinoma.  Pancreatic neck EUS FNA: Malignant cells present consistent with adenocarcinoma.  Mismatch repair proteins are ordered and will reported in an addendum.  Microsatellite instability test by PCR is also been ordered and will be reported separately.  Part A contain sufficient tissue for further testing.  05/11/2019 this is patient's first visit with [facility] oncology.  Husband Ron participated in person son Trey Paula who is a [employer], by phone.  Discussed biopsy results and also stage and prognosis of stage IV pancreatic cancer.  Talked about treatment options including FOLFIRINOX with growth factor, Gemzar and Abraxane, single agent Gemzar, no therapy/palliative care.  Patient is very good performance status.  Would suggest slightly dose reduced FOLFIRINOX with growth factor.  Patient will think about it but is willing to set things in motion for PowerPort placement and chemotherapy and teaching etc.  They plan to move here from [facility] in the near future.  05/14/2019 [place] CT chest multiple tiny bilateral pulmonary nodules suspicious for metastatic disease the likely 2 small for definitive PET/CT characterization or biopsy.  A follow-up chest CT in 2-3 months is suggested to assess for stability.  No thoracic adenopathy.  05/19/19: FOLFIRINOX education  05/20/19 Started modified FOLFIRINOX  05/26/19: Toxicity check: Tolerated first cycle relatively well. Minimal nausea and diarrhea. 1 liter of fluids in clinic today. Having back pain. Recommended trying some ibuprofen, heat and massage, reassess next week with Dr. Madison Hickman.  06/03/2019 cycle cycle 1 day 15 FOLFIRINOX appetite improving less back pain all very encouraging tumor marker pending lab work looks good.  Proceed with cycle 1 day 15 see nurse in 2 weeks physician in 4 weeks imaging after 2 months  06/16/19  C3 FOLFIRINOX.  Tolerating therapy very well.  Tumor marker continues to decrease.  FU with Dr. Madison Hickman in 2 weeks.    07/08/19  C4 FOLFIRINOX.  Improved appetite.  CT prior to FU with Dr. Georgiann Hahn in 2 weeks.  Note that tumor marker is rising.   07/20/2019 [place] CT CAP: Questionable nonocclusive filling defect in partially visualized right IJ vein.  May be from mixing artifact versus small clot.  No thoracic adenopathy.  No significant change in scattered indeterminate bilateral pulmonary nodules.  No significant change in size of infiltrative pancreatic malignancy since 05/14/2019.  Persistent encasement.  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  Additional small nonocclusive thrombus in the  left renal vein.  Dominant right hepatic metastasis are not significantly changed since 05/14/2019.  Smaller scattered hepatic metastasis are decreased in size.  No new abdominal pelvic mass or ascites.  07/21/2019 cycle 5 FOLFIRINOX with Neulasta support.  Platelets 99,000.  Will decrease oxaliplatin further to 50 mg/metered squared.  Discussed clot with patient.  No bleeding reported.  Will begin Xarelto.  Husband is on same medications.  Patient aware and discussed risks and benefits.  Will return in 2 in 4 weeks for additional chemo.  08/11/19  Pt is 1 week later for C6 FOLFIRINOX due to severe diarrhea last week.  Sx have resolved.  She will take Imodium prn.  FU with Dr. Madison Hickman in 2 weeks.    09/09/19  C7 FOLFIRINOX at continued dose reductions for low PLT and diarrhea.  FU in 2 and 4 weeks.  If tumor marker continues to rise, plan to scan in October.    09/23/19  C8 FOLFIRINOX.  Pt tolerating chemo exceptionally well.  Tumor marker is rising so will order CT CAP prior to FU with Dr. Madison Hickman in 2 weeks.    10/05/2019 [place] CT CAP: No thoracic adenopathy.  No appreciable change in indeterminate bilateral pulmonary nodules.  Slight interval decrease in size of pancreatic neoplasm.  Multifocal hepatic metastasis are overall increased in size and number from prior with one met decrease in size.  Persistent encasement and narrowing of the portal confluence with interval decrease in nonocclusive thrombus in the main portal vein and resolution of thrombus in the SMV.  No new abdominal pelvic lymphadenopathy or ascites.,,,,,, consider change to Gemzar Abraxane  10/06/2019 reviewed scans with patient which unfortunately show progressive disease. Talk about changing to Gemzar and Abraxane. After patient left realized we had not tested for mutations in BRCA1/PALB/NTRK/MSI stability. We will check into foundation 1 liquid CDX testing.  10/12/19  Gemzar/Abraxane education.  Start 10/15/19.  1 wk tox check.  Covid Moderna #3 today.      Active therapy Gemzar and Abraxane    [facility] [place]              Stage IV pancreatic cancer.    APP Chemotherapy Education    IV Chemotherapy: The following is a summary of the patient's IV Chemotherapy Education.    A thorough pre-assessment and teaching session explaining the mechanism of action, possible side effects, precautions and instructions regarding Gemzar/Abraxane for control intent was conducted. The patient will return on 10/15/19 to initiate treatment. Gemzar and Abraxane are given weekly on days 1, 8, 15. The cycle will repeat every 28 days unless disease progresses or unacceptable toxicity is reached.     Plan of administration was reviewed.    Both written and verbal information were given to the patient.    The planned course of treatment, anticipated benefits, material risks and potential side effects that may occur with this course of treatment were explained to the patient.    Reproductive concerns were not discussed with the patient  Infertility risks were not applicable and therefore not discussed    Appropriate handling of body secretions and waste at home were reviewed as applicable.    Prescriptions for supportive medications including Zofran were e-scripted to their pharmacy and discussed in detail how to take. Drug to drug interactions were reviewed as applicable.     The patient has received contact information for the clinic and was instructed on when and who to call.     The patient verbalized understanding, was  given the opportunity to ask questions, and the consent form was signed.     Follow up appointment with nurse practitioner in 1 week for toxicity check, Dr. Georgiann Hahn 3 weeks later.    Lab weekly per treatment plan.      COVID pandemic.  Pt will receive Moderna #3 here today.      This was a 30 minute face to face encounter with 30 minutes spent in counseling and coordination of care.

## 2019-10-12 NOTE — Progress Notes
Patient presented to clinic for lab draw. Port accessed with positive blood return and lab draw completed. Port flushed with heparin and de-accessed per protocol. Patient discharged off unit via ambulation in good condition.

## 2019-10-13 LAB — CA125: Lab: 18 U/mL (ref <35)

## 2019-10-15 ENCOUNTER — Encounter: Admit: 2019-10-15 | Discharge: 2019-10-15 | Payer: MEDICARE

## 2019-10-15 DIAGNOSIS — C259 Malignant neoplasm of pancreas, unspecified: Secondary | ICD-10-CM

## 2019-10-15 MED ORDER — ONDANSETRON 8 MG PO TBDI
16 mg | Freq: Once | ORAL | 0 refills | Status: CP
Start: 2019-10-15 — End: ?
  Administered 2019-10-15: 14:00:00 16 mg via ORAL

## 2019-10-15 MED ORDER — HEPARIN, PORCINE (PF) 100 UNIT/ML IV SYRG
500 [IU] | Freq: Once | 0 refills | Status: CP
Start: 2019-10-15 — End: ?

## 2019-10-15 MED ORDER — PACLITAXEL PBP IVPB
125 mg/m2 | Freq: Once | INTRAVENOUS | 0 refills | Status: CP
Start: 2019-10-15 — End: ?
  Administered 2019-10-15: 15:00:00 200 mg via INTRAVENOUS

## 2019-10-15 MED ORDER — GEMCITABINE IVPB
1000 mg/m2 | Freq: Once | INTRAVENOUS | 0 refills | Status: CP
Start: 2019-10-15 — End: ?
  Administered 2019-10-15 (×2): 1639.9 mg via INTRAVENOUS

## 2019-10-15 NOTE — Progress Notes
CHEMO NOTE  Verified chemo consent signed and in chart.    Verified initiate chemo order in O2    Blood return positive via: Port (Single, Power Port and Accessed)    BSA and dose double checked (agree with orders as written) with: yes     Labs/applicable tests checked: CBC, Comprehensive Metabolic Panel (CMP) and Tumor Marker    Chemo regime: Drug/cycle/day Cycle 1 Day 1 Abraxane/Gemzar    Rate verified and armband double checkwith second RN: yes    Patient education offered and stated understanding. Denies questions at this time.    Patient here for treatment. Labs within normal limits for treatment. Port accessed. Voices no complaints.  Premeds administered. Treatment administered as ordered and tolerated well. Discharged home ambulatory.

## 2019-10-15 NOTE — Patient Instructions
Call Immediately to report the following:  Uncontrolled nausea and/or vomiting, uncontrolled pain, or unusual bleeding.  Temperature of 100.4 F or greater and/or any sign/symptom of infection (redness, warmth, tenderness)  Painful mouth or difficulty swallowing  Red, cracked, or painful hands and/or feet  Diarrhea   Swelling of arms or legs  Rash    Important Phone Numbers:  [facility] Main Number (answered 24 hours a day) [phone]  Cancer Center Scheduling (appointments) [phone]  Cancer Action (for nutritional supplements) [phone]

## 2019-10-21 ENCOUNTER — Encounter: Admit: 2019-10-21 | Discharge: 2019-10-21 | Payer: MEDICARE

## 2019-10-22 ENCOUNTER — Encounter: Admit: 2019-10-22 | Discharge: 2019-10-22 | Payer: MEDICARE

## 2019-10-22 DIAGNOSIS — C259 Malignant neoplasm of pancreas, unspecified: Secondary | ICD-10-CM

## 2019-10-22 DIAGNOSIS — R978 Other abnormal tumor markers: Secondary | ICD-10-CM

## 2019-10-22 DIAGNOSIS — M549 Dorsalgia, unspecified: Secondary | ICD-10-CM

## 2019-10-22 DIAGNOSIS — G20 Parkinson's disease: Secondary | ICD-10-CM

## 2019-10-22 MED ORDER — HEPARIN, PORCINE (PF) 100 UNIT/ML IV SYRG
500 [IU] | Freq: Once | 0 refills | Status: CP
Start: 2019-10-22 — End: ?

## 2019-10-22 MED ORDER — GEMCITABINE IVPB
1000 mg/m2 | Freq: Once | INTRAVENOUS | 0 refills | Status: CP
Start: 2019-10-22 — End: ?
  Administered 2019-10-22 (×2): 1639.9 mg via INTRAVENOUS

## 2019-10-22 MED ORDER — PACLITAXEL PBP IVPB
125 mg/m2 | Freq: Once | INTRAVENOUS | 0 refills | Status: CP
Start: 2019-10-22 — End: ?
  Administered 2019-10-22: 18:00:00 200 mg via INTRAVENOUS

## 2019-10-22 MED ORDER — ONDANSETRON 8 MG PO TBDI
16 mg | Freq: Once | ORAL | 0 refills | Status: CP
Start: 2019-10-22 — End: ?
  Administered 2019-10-22: 17:00:00 16 mg via ORAL

## 2019-10-22 NOTE — Patient Instructions
Call Immediately to report the following:  Uncontrolled nausea and/or vomiting, uncontrolled pain, or unusual bleeding.  Temperature of 100.4 F or greater and/or any sign/symptom of infection (redness, warmth, tenderness)  Painful mouth or difficulty swallowing  Red, cracked, or painful hands and/or feet  Diarrhea   Swelling of arms or legs  Rash    Important Phone Numbers:  [facility] Main Number (answered 24 hours a day) [phone]  Cancer Center Scheduling (appointments) [phone]  Cancer Action (for nutritional supplements) [phone]

## 2019-10-22 NOTE — Progress Notes
Name: Cassandra Wallace          MRN: [id number]      DOB: 1945-04-06      AGE: 74 y.o.   DATE OF SERVICE: 10/22/2019    Subjective:             Reason for Visit:  Follow Up      Cassandra Wallace is a 74 y.o. female.     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  Unfortunately a CT of the abdomen and pelvis done in [place] showed pancreatic lesions as well as 3 right hepatic lesions.  An MRI saw the same lesions.  An EUS performed on May 07, 2019 at [facility] unfortunately found to abnormal lesions in the left lobe of the liver and a pancreatic mass in the neck the pancreas was invading the superior mesenteric artery.  Biopsies of both came back consistent with adenocarcinoma.  A CA 19-9 came back as [name] which is elevated.  Her chemistries were normal.  After long discussion the patient began FOLFIRINOX chemotherapy growth factor support on about May 20, 2019.  The patient had a scan on 07/20/2019 showing stable and other lesions that are decreased in size.  It also showed portal vein thrombus. Unfortunately recent scan on 10/05/2019 showed progressive disease. We do note that the clot previously seen was smaller/resolved.  The patient began Gemzar Abraxane on about October 15, 2019.    The patient is here for cycle 1 day 8 of her Gemzar and Abraxane.  Platelets are little bit low.  She is tolerating this better with less nausea perhaps slightly more constipation.  She did benefit from back adjustment by her son who is a Land.    We talked about the possibility that we will need to skip day 15 going forward.  If so we may do day 1 day 8 skip 15 on a 21-day basis.  She will need a scan prior to cycle 3.    Cassandra Wallace has a very supportive family including her husband Cassandra Wallace and her son Cassandra Wallace, who is a Land here in town.  I believe there are other children in the family.    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  He mostly worked in [place].  His name is Czechoslovakian/Bohemian.  We talked about ethnic foods.  Evia on the other hand is originally [place] and her ethnicity.  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    In addition to the history of metastatic pancreatic cancer diagnosis and portal vein thrombosis, the patient also has a history of Parkinson's though she has been very stable.    The patient is fully vaccinated regarding the Covid virus.    The patient will continue with her cycle 1 day 8 chemotherapy today.  She will tentatively return to see the nurse in 3 weeks for  cycle 2-day 1.  She will need a scan ordered before cycle 3.  We may need to adjust the timing of her chemo depend on how her counts do.  So far she is tolerating it very well.           Review of Systems   Constitutional: Positive for fatigue. Negative for chills and fever.   HENT: Negative for nosebleeds.    Eyes: Negative for pain and visual disturbance.   Respiratory: Negative for cough and shortness of breath.    Cardiovascular: Negative for chest pain and leg swelling.   Gastrointestinal: Negative for abdominal pain.   Genitourinary: Negative for hematuria.   Musculoskeletal: Negative for joint swelling and neck pain.   Skin: Negative for rash.   Neurological: Negative for dizziness and weakness.   Hematological: Negative for adenopathy.   Psychiatric/Behavioral: Negative for confusion.   All other systems reviewed and are negative.        Objective:           Vitals:    10/22/19 1101   BP: 131/72   BP Source: Arm, Left Upper   Patient Position: Sitting   Pulse: 91   Resp: 16   Temp: 36.6 ?C (97.9 ?F)   TempSrc: Oral   SpO2: 99%   Weight: 61.1 kg (134 lb 12.8 oz)   Height: 161.3 cm (63.5)   PainSc: Zero     Body mass index is 23.5 kg/m?Marland Kitchen     Medical History:   Diagnosis Date   ? Back pain    ? Pancreatic cancer (HCC)     mets to liver   ? Parkinson's disease (HCC) 2016     Surgical History:   Procedure Laterality Date   ? CATARACT REMOVAL Bilateral 2020   ? ESOPHAGOGASTRODUODENOSCOPY WITH TRANSENDOSCOPIC ULTRASOUND GUIDED FINE NEEDLE ASPIRATION/ BIOPSY N/A 05/07/2019    Performed by Comer Locket, MD at [facility] ENDO      Social History     Tobacco Use   ? Smoking status: Never Smoker   ? Smokeless tobacco: Never Used   Vaping Use   ? Vaping Use: Never used   Substance Use Topics   ? Alcohol use: Not Currently   ? Drug use: Never     Family History   Problem Relation Age of Onset   ? Cancer-Uterine Mother    ? Arthritis-rheumatoid Father          Pain Score: Zero            Pain Addressed:  N/A    Patient Evaluated for a Clinical Trial: No treatment clinical trial available for this patient.     [name] Cooperative Oncology Group performance status is 1, Restricted in physically strenuous activity but ambulatory and able to carry out work of a light or sedentary nature, e.g., light house work, office work.     Physical Exam          Assessment and Plan:  Patient Active Problem List    Diagnosis Date Noted   ? Pancreatic cancer metastasized to liver [facility]) 05/07/2019     Priority: Medium     04/13/2019 [facility] medical imaging [facility] CT abdomen pelvis with contrast: Abdominal pain constipation unintentional weight loss.  The head of the pancreas is 3 hypodense lesions.  The lesion towards the right lateral side measures 1.8 cm.  Anterior lesion the mild/mid head measures 11.4 cm.  There are 3 right hepatic lesions that are suspicious.  Impression: Multifocal  pancreas lesion suspicious for tumor such as adenocarcinoma.  Could also be focal inflammation from subacute or chronic pancreatitis.  04/20/2019 [place] medical imaging [facility] MRI abdomen without and with contrast.  Indeterminate lesions in the pancreatic bed and proximal body.  Pancreatic neoplasm cannot be excluded.  This is compared to CT/12/2019.  Several right and left hepatic lesions are present.  The 2 largest are adjacent to 1 another.  Measuring 2.1 x 1.8 cm.  Concerning for hepatic metastatic lesions.  Heterogeneous process pancreatic head and proximal body the whole area measures approximately 5.2 x 3.5 cm.  It appears to be least some encasement of the associated narrowing of the superior mesenteric vein  04/23/2019 [facility] clinic note.  Patient had Covid in November 2020.  Recent imaging showed spots on pancreas.  History of Parkinson's weight 156 pounds  05/07/2019 [facility] EUS report examination of the left lobe of the liver revealed 2 lesions measuring 1 cm each.  FNA x4 was performed upper limit result is positive for malignant cells.  Examination of pancreatic revealed a mass in the neck the pancreas with invasion to SMA.  Fine-needle biopsy was obtained and preliminary is all is positive for malignant cells.  Await cytology results and await path results.  05/07/2019 CA 19-9 1782, H 12.3, platelets 120 low, W3.5 low, chemistries normal,  05/07/2019 [place] pathology/cytology liver US-FNA malignant cells present consistent with adenocarcinoma.  Pancreatic neck EUS FNA: Malignant cells present consistent with adenocarcinoma.  Mismatch repair proteins are ordered and will reported in an addendum.  Microsatellite instability test by PCR is also been ordered and will be reported separately.  Part A contain sufficient tissue for further testing.  05/11/2019 this is patient's first visit with [facility] oncology.  Husband Cassandra Wallace participated in person son Cassandra Wallace who is a Land, by phone.  Discussed biopsy results and also stage and prognosis of stage IV pancreatic cancer.  Talked about treatment options including FOLFIRINOX with growth factor, Gemzar and Abraxane, single agent Gemzar, no therapy/palliative care.  Patient is very good performance status.  Would suggest slightly dose reduced FOLFIRINOX with growth factor.  Patient will think about it but is willing to set things in motion for PowerPort placement and chemotherapy and teaching etc.  They plan to move here from [facility] in the near future.  05/14/2019 [place] CT chest multiple tiny bilateral pulmonary nodules suspicious for metastatic disease the likely 2 small for definitive PET/CT characterization or biopsy.  A follow-up chest CT in 2-3 months is suggested to assess for stability.  No thoracic adenopathy.  05/19/19: FOLFIRINOX education  05/20/19 Started modified FOLFIRINOX  05/26/19: Toxicity check: Tolerated first cycle relatively well. Minimal nausea and diarrhea. 1 liter of fluids in clinic today. Having back pain. Recommended trying some ibuprofen, heat and massage, reassess next week with Dr. Madison Hickman.  06/03/2019 cycle cycle 1 day 15 FOLFIRINOX appetite improving less back pain all very encouraging tumor marker pending lab work looks good.  Proceed with cycle 1 day 15 see nurse in 2 weeks physician in 4 weeks imaging after 2 months  06/16/19  C3 FOLFIRINOX.  Tolerating therapy very well.  Tumor marker continues to decrease.  FU with Dr. Madison Hickman in 2 weeks.    07/08/19  C4 FOLFIRINOX.  Improved appetite.  CT prior to FU with Dr. Georgiann Hahn in 2 weeks.  Note that tumor marker is rising.   07/20/2019 [place] CT CAP: Questionable nonocclusive filling defect in partially visualized right IJ vein.  May be  from mixing artifact versus small clot.  No thoracic adenopathy.  No significant change in scattered indeterminate bilateral pulmonary nodules.  No significant change in size of infiltrative pancreatic malignancy since 05/14/2019. Persistent encasement.  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  Additional small nonocclusive thrombus in the left renal vein.  Dominant right hepatic metastasis are not significantly changed since 05/14/2019.  Smaller scattered hepatic metastasis are decreased in size.  No new abdominal pelvic mass or ascites.  07/21/2019 cycle 5 FOLFIRINOX with Neulasta support.  Platelets 99,000.  Will decrease oxaliplatin further to 50 mg/metered squared.  Discussed clot with patient.  No bleeding reported.  Will begin Xarelto.  Husband is on same medications.  Patient aware and discussed risks and benefits.  Will return in 2 in 4 weeks for additional chemo.  08/11/19  Pt is 1 week later for C6 FOLFIRINOX due to severe diarrhea last week.  Sx have resolved.  She will take Imodium prn.  FU with Dr. Madison Hickman in 2 weeks.    09/09/19  C7 FOLFIRINOX at continued dose reductions for low PLT and diarrhea.  FU in 2 and 4 weeks.  If tumor marker continues to rise, plan to scan in October.    09/23/19  C8 FOLFIRINOX.  Pt tolerating chemo exceptionally well.  Tumor marker is rising so will order CT CAP prior to FU with Dr. Madison Hickman in 2 weeks.    10/05/2019 [place] CT CAP: No thoracic adenopathy.  No appreciable change in indeterminate bilateral pulmonary nodules.  Slight interval decrease in size of pancreatic neoplasm.  Multifocal hepatic metastasis are overall increased in size and number from prior with one met decrease in size.  Persistent encasement and narrowing of the portal confluence with interval decrease in nonocclusive thrombus in the main portal vein and resolution of thrombus in the SMV.  No new abdominal pelvic lymphadenopathy or ascites.,,,,,, consider change to Gemzar Abraxane  10/06/2019 reviewed scans with patient which unfortunately show progressive disease. Talk about changing to Gemzar and Abraxane. After patient left realized we had not tested for mutations in BRCA1/PALB/NTRK/MSI stability. We will check into foundation 1 liquid CDX testing.  10/12/19  Gemzar/Abraxane education.  Start 10/15/19.  1 wk tox check.  Covid Moderna #3 today.    10/15/2019 cycle 1 day 1 Gemzar Abraxane  10/22/2019 cycle 1 day 8.  Tolerated very well.  Less nausea does have some slight constipation.  Will return next week and hope that counts are good for day 15 then see the nurse in 3 weeks for cycle 2-day 1.  Gave patient warning that we may go to day 1 day 8 skip 15 on a 21-day cycle if her counts do not allow this.  We will also need to order scan prior to cycle 3.  Tolerating well in general.    Active therapy Gemzar and Abraxane    [place]       ? 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  Patient aware to call if bleeding or unusual bruising.  Has been on same medication and is fairly familiar.     ? Dehydration 05/26/2019   ? 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 CINV (chemotherapy-induced nausea and vomiting) 05/26/2019   ? Dizziness  05/26/2019   ? Diarrhea 05/26/2019   ? Parkinson's disease (HCC) 05/11/2019     This note was created with partial dictation using a dragon dictation system, please notify us if you notice any errors of omissions or content.                ? carbidopa/levodopa (SINEMET) 25/100 mg tablet Take 1 tablet by mouth three times daily.   ? lidocaine/prilocaine (EMLA) 2.5/2.5 % topical cream Apply a thin film over port one hour prior to treatment. Do not rub in. Cover with plastic wrap after application   ? loperamide (IMODIUM A-D) 2 mg capsule Take 2 capsules by mouth after first loose/frequent bowel movement, then 1 capsule every 2 hours (2 capsules every 4 hours at night) until 12 hours have passed without a bowel movement.   ? ondansetron HCL (ZOFRAN) 8 mg tablet Take one tablet by mouth every 8 hours as needed for Nausea.   ? rivaroxaban (XARELTO) 20 mg tablet Take one tablet by mouth daily. Take with food.

## 2019-10-22 NOTE — Progress Notes
CHEMO NOTE  Verified chemo consent signed and in chart.    Verified initiate chemo order in O2    Blood return positive via: Port (Single, Power Port and Accessed)    BSA and dose double checked (agree with orders as written) with: yes     Labs/applicable tests checked: CBC and Comprehensive Metabolic Panel (CMP)    Chemo regime: Drug/cycle/day Cycle 1 Day 8 Abraxane / Gemzar    Rate verified and armband double checkwith second RN: yes    Patient education offered and stated understanding. Denies questions at this time.    Port accessed for labs and treatment. Patient was assessed by MD  in clinic today. Labs within normal limits for treatment. Premeds administered. Treatment administered as ordered and tolerated well. Discharged home ambulatory.

## 2019-10-29 ENCOUNTER — Encounter: Admit: 2019-10-29 | Discharge: 2019-10-29 | Payer: MEDICARE

## 2019-10-29 DIAGNOSIS — C259 Malignant neoplasm of pancreas, unspecified: Secondary | ICD-10-CM

## 2019-10-29 DIAGNOSIS — M549 Dorsalgia, unspecified: Secondary | ICD-10-CM

## 2019-10-29 DIAGNOSIS — G20 Parkinson's disease: Secondary | ICD-10-CM

## 2019-10-29 LAB — CA19.9: Lab: 252 U/mL — ABNORMAL HIGH (ref <35)

## 2019-10-29 MED ORDER — HEPARIN, PORCINE (PF) 100 UNIT/ML IV SYRG
500 [IU] | Freq: Once | 0 refills | Status: CP
Start: 2019-10-29 — End: ?

## 2019-10-29 MED ORDER — CIPROFLOXACIN HCL 500 MG PO TAB
500 mg | ORAL_TABLET | Freq: Two times a day (BID) | ORAL | 0 refills | 10.00000 days | Status: AC
Start: 2019-10-29 — End: ?

## 2019-10-29 NOTE — Progress Notes
Notified pt that she has a UTI and cipro x3d to be sent to her local pharmacy.  Per lab, there is not enough specimen for C&S.  Pt will call if sx persist.  Metro Kung, APRN-NP

## 2019-10-29 NOTE — Patient Instructions
Call Immediately to report the following:  Uncontrolled nausea and/or vomiting, uncontrolled pain, or unusual bleeding.  Temperature of 100.4 F or greater and/or any sign/symptom of infection (redness, warmth, tenderness)  Painful mouth or difficulty swallowing  Red, cracked, or painful hands and/or feet  Diarrhea   Swelling of arms or legs  Rash    Important Phone Numbers:  [facility] Main Number (answered 24 hours a day) [phone]  Cancer Center Scheduling (appointments) [phone] OR [phone]  [facility] (for nutritional supplements) [phone]        Port Maintenance - If you have a port, it should be flushed every 6-8 weeks when not in use.  Please check with your MD, nurse, or the scheduler.

## 2019-11-05 ENCOUNTER — Encounter: Admit: 2019-11-05 | Discharge: 2019-11-05 | Payer: MEDICARE

## 2019-11-05 DIAGNOSIS — C259 Malignant neoplasm of pancreas, unspecified: Secondary | ICD-10-CM

## 2019-11-05 DIAGNOSIS — M549 Dorsalgia, unspecified: Secondary | ICD-10-CM

## 2019-11-05 DIAGNOSIS — G20 Parkinson's disease: Secondary | ICD-10-CM

## 2019-11-05 LAB — CA19.9: Lab: 295 U/mL — ABNORMAL HIGH (ref <35)

## 2019-11-05 MED ORDER — ONDANSETRON 8 MG PO TBDI
16 mg | Freq: Once | ORAL | 0 refills | Status: CP
Start: 2019-11-05 — End: ?
  Administered 2019-11-05: 16:00:00 16 mg via ORAL

## 2019-11-05 MED ORDER — PACLITAXEL PBP IVPB
125 mg/m2 | Freq: Once | INTRAVENOUS | 0 refills | Status: CP
Start: 2019-11-05 — End: ?
  Administered 2019-11-05: 16:00:00 200 mg via INTRAVENOUS

## 2019-11-05 MED ORDER — GEMCITABINE IVPB
1000 mg/m2 | Freq: Once | INTRAVENOUS | 0 refills | Status: CP
Start: 2019-11-05 — End: ?
  Administered 2019-11-05 (×3): 1639.9 mg via INTRAVENOUS

## 2019-11-05 MED ORDER — HEPARIN, PORCINE (PF) 100 UNIT/ML IV SYRG
500 [IU] | Freq: Once | 0 refills | Status: CP
Start: 2019-11-05 — End: ?

## 2019-11-05 NOTE — Progress Notes
Port accessed, labs collected.  Parameters met for treatment and pt denies complaints at this time.  Plan given as ordered. Pt tolerated well.  Port de-accessed per protocol, heparinized.   Discharged in good condition, ambulatory.     CHEMO NOTE  Verified chemo consent signed and in chart.    Verified initiate chemo order in O2    Blood return positive via: Port (Single)    BSA and dose double checked (agree with orders as written) with: yes     Labs/applicable tests checked: CBC and Comprehensive Metabolic Panel (CMP)    Chemo regime: Drug Gemzar Abraxane /cycle 1 /day 15.    Rate verified and armband double checkwith second RN: yes    Patient education offered and stated understanding. Denies questions at this time.

## 2019-11-16 ENCOUNTER — Encounter: Admit: 2019-11-16 | Discharge: 2019-11-16 | Payer: MEDICARE

## 2019-11-19 ENCOUNTER — Encounter: Admit: 2019-11-19 | Discharge: 2019-11-19 | Payer: MEDICARE

## 2019-11-19 DIAGNOSIS — C259 Malignant neoplasm of pancreas, unspecified: Secondary | ICD-10-CM

## 2019-11-19 DIAGNOSIS — M549 Dorsalgia, unspecified: Secondary | ICD-10-CM

## 2019-11-19 DIAGNOSIS — G20 Parkinson's disease: Secondary | ICD-10-CM

## 2019-11-19 MED ORDER — ONDANSETRON 8 MG PO TBDI
16 mg | Freq: Once | ORAL | 0 refills | Status: CP
Start: 2019-11-19 — End: ?
  Administered 2019-11-19: 18:00:00 16 mg via ORAL

## 2019-11-19 MED ORDER — PACLITAXEL PBP IVPB
125 mg/m2 | Freq: Once | INTRAVENOUS | 0 refills | Status: CP
Start: 2019-11-19 — End: ?
  Administered 2019-11-19: 19:00:00 200 mg via INTRAVENOUS

## 2019-11-19 MED ORDER — GEMCITABINE IVPB
1000 mg/m2 | Freq: Once | INTRAVENOUS | 0 refills | Status: CP
Start: 2019-11-19 — End: ?
  Administered 2019-11-19 (×3): 1639.9 mg via INTRAVENOUS

## 2019-11-19 MED ORDER — HEPARIN, PORCINE (PF) 100 UNIT/ML IV SYRG
500 [IU] | Freq: Once | 0 refills | Status: CP
Start: 2019-11-19 — End: ?

## 2019-11-19 NOTE — Progress Notes
CHEMO NOTE  Verified chemo consent signed and in chart.    Verified initiate chemo order in O2    Blood return positive via: Port (Single)    BSA and dose double checked (agree with orders as written) with: yes see MAR    Labs/applicable tests checked: CBC and Comprehensive Metabolic Panel (CMP)    Chemo regime: Drug/cycle/day  cycle 2, day 1 Abraxane, Gemzar    Rate verified and armband double checkwith second RN: yes    Patient education offered and stated understanding. Denies questions at this time.

## 2019-11-19 NOTE — Progress Notes
Name: Cassandra Wallace          MRN: [id number]      DOB: 1945-12-19      AGE: 74 y.o.   DATE OF SERVICE: 11/19/2019    Subjective:             Reason for Visit:  Follow Up      Cassandra Wallace is a 73 y.o. female.     Cancer Staging  Pancreatic cancer metastasized to liver [facility])  Staging form: Exocrine Pancreas, AJCC 8th Edition  - Clinical stage from 05/09/2019: Stage IV (cT4, cN0, pM1) - Signed by Lady Deutscher, MD on 05/09/2019      History of Present Illness  Cassandra Wallace is here today in FU of her metastatic pancreatic cancer.??She has a history of abdominal pain, constipation and unintentional weight loss. ?Unfortunately a CT ?abdomen and pelvis done in [place] showed pancreatic lesions as well as 3 right hepatic lesions. ?An MRI saw the same lesions. ?An EUS performed on May 07, 2019 at [facility] found to abnormal lesions in the left lobe of the liver and a pancreatic mass in the neck the pancreas that was invading the superior mesenteric artery. Biopsies of both came back consistent with adenocarcinoma. A CA 19-9 came back as [name] which is elevated.??  ?  --?Started?FOLFIRINOX ?on 05/20/19.  ?  -- CT on 07/20/19 showed stable disease. ?Incidental finding of portal vein thrombus. ?Pt started on Xarelto. ?  ?  -- CT on 10/05/19 saw several new and enlarging liver lesions.      -- Started Gemzar/Abraxane on 10/15/19    Interim hx:     NO new sx or concerns to report.    ?       Review of Systems   Constitutional: Positive for appetite change (improved) and fatigue (afternoon nap required).   HENT: Negative.  Negative for mouth sores.    Respiratory: Negative.    Cardiovascular: Negative.  Negative for leg swelling.   Gastrointestinal: Negative.  Negative for constipation and nausea.   Genitourinary: Negative.  Negative for enuresis and hematuria.   Musculoskeletal: Positive for back pain (mild).   Skin: Negative.  Negative for rash.   Neurological: Negative.  Negative for numbness.   Hematological: Negative. Psychiatric/Behavioral: Negative.    All other systems reviewed and are negative.        Objective:         ? carbidopa/levodopa (SINEMET) 25/100 mg tablet Take 1 tablet by mouth three times daily.   ? lidocaine/prilocaine (EMLA) 2.5/2.5 % topical cream Apply a thin film over port one hour prior to treatment. Do not rub in. Cover with plastic wrap after application   ? loperamide (IMODIUM A-D) 2 mg capsule Take 2 capsules by mouth after first loose/frequent bowel movement, then 1 capsule every 2 hours (2 capsules every 4 hours at night) until 12 hours have passed without a bowel movement.   ? ondansetron HCL (ZOFRAN) 8 mg tablet Take one tablet by mouth every 8 hours as needed for Nausea.   ? rivaroxaban (XARELTO) 20 mg tablet Take one tablet by mouth daily. Take with food.     Vitals:    11/19/19 1128   BP: (!) 165/83   BP Source: Arm, Right Upper   Patient Position: Sitting   Pulse: 92   Resp: 16   Temp: 37.3 ?C (99.2 ?F)   TempSrc: Oral   SpO2: 100%   Weight: 63 kg (139 lb)   Height:  161.3 cm (63.5)   PainSc: Three     Body mass index is 24.24 kg/m?Marland Kitchen     Pain Score: Three  Pain Loc: Back    Fatigue Scale: 3    Pain Addressed:  N/A    Patient Evaluated for a Clinical Trial: No treatment clinical trial available for this patient.     [name] Cooperative Oncology Group performance status is 0, Fully active, able to carry on all pre-disease performance without restriction.Marland Kitchen     Physical Exam  Vitals reviewed.   Constitutional:       Appearance: Normal appearance.   HENT:      Head: Normocephalic.   Eyes:      Extraocular Movements: Extraocular movements intact.   Cardiovascular:      Rate and Rhythm: Normal rate.   Pulmonary:      Effort: Pulmonary effort is normal.   Musculoskeletal:         General: Normal range of motion.      Cervical back: Normal range of motion.   Skin:     General: Skin is warm and dry.   Neurological:      General: No focal deficit present.      Mental Status: She is alert and oriented to person, place, and time.   Psychiatric:         Mood and Affect: Mood normal.         Behavior: Behavior normal.         Thought Content: Thought content normal.          Results for orders placed or performed during the hospital encounter of 11/19/19 (from the past 336 hour(s))   CBC AND DIFF   Result Value Ref Range    White Blood Cells 6.0 4.5 - 11.0 K/UL    RBC 3.56 (L) 4.0 - 5.0 M/UL    Hemoglobin 11.1 (L) 12.0 - 15.0 GM/DL    Hematocrit 16.1 (L) 36 - 45 %    MCV 97.4 80 - 100 FL    MCH 31.2 26 - 34 PG    MCHC 32.0 32.0 - 36.0 G/DL    RDW 09.6 11 - 15 %    Platelet Count 131 (L) 150 - 400 K/UL    MPV 9.8 7 - 11 FL    Neutrophils 61 41 - 77 %    Lymphocytes 15 (L) 24 - 44 %    Monocytes 17 (H) 4 - 12 %    Eosinophils 6 (H) 0 - 5 %    Basophils 1 0 - 2 %    Absolute Neutrophil Count 3.70 1.8 - 7.0 K/UL    Absolute Lymph Count 0.90 (L) 1.0 - 4.8 K/UL    Absolute Monocyte Count 1.00 (H) 0 - 0.80 K/UL    Absolute Eosinophil Count 0.30 0 - 0.45 K/UL    Absolute Basophil Count 0.00 0 - 0.20 K/UL          Assessment and Plan:       Patient Active Problem List    Diagnosis Date Noted   ? 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  Patient aware to call if bleeding or unusual bruising.  Has been on same medication and is fairly familiar.     ? Dehydration 05/26/2019   ? 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  CINV (chemotherapy-induced nausea and vomiting) 05/26/2019   ? Dizziness 05/26/2019   ? Diarrhea 05/26/2019   ? Parkinson's disease (HCC) 05/11/2019   ? Pancreatic cancer metastasized to liver [facility]) 05/07/2019     04/13/2019 [facility] medical imaging [facility] CT abdomen pelvis with contrast: Abdominal pain constipation unintentional weight loss.  The head of the pancreas is 3 hypodense lesions.  The lesion towards the right lateral side measures 1.8 cm.  Anterior lesion the mild/mid head measures 11.4 cm.  There are 3 right hepatic lesions that are suspicious.  Impression: Multifocal pancreas lesion suspicious for tumor such as adenocarcinoma.  Could also be focal inflammation from subacute or chronic pancreatitis.  04/20/2019 [facility] medical imaging [facility] MRI abdomen without and with contrast.  Indeterminate lesions in the pancreatic bed and proximal body.  Pancreatic neoplasm cannot be excluded.  This is compared to CT/12/2019.  Several right and left hepatic lesions are present.  The 2 largest are adjacent to 1 another.  Measuring 2.1 x 1.8 cm.  Concerning for hepatic metastatic lesions.  Heterogeneous process pancreatic head and proximal body the whole area measures approximately 5.2 x 3.5 cm.  It appears to be least some encasement of the associated narrowing of the superior mesenteric vein  04/23/2019 [facility] clinic note.  Patient had Covid in November 2020.  Recent imaging showed spots on pancreas.  History of Parkinson's weight 156 pounds  05/07/2019 [place] EUS report examination of the left lobe of the liver revealed 2 lesions measuring 1 cm each.  FNA x4 was performed upper limit result is positive for malignant cells.  Examination of pancreatic revealed a mass in the neck the pancreas with invasion to SMA.  Fine-needle biopsy was obtained and preliminary is all is positive for malignant cells.  Await cytology results and await path results.  05/07/2019 CA 19-9 1782, H 12.3, platelets 120 low, W3.5 low, chemistries normal,  05/07/2019 [place] pathology/cytology liver US-FNA malignant cells present consistent with adenocarcinoma.  Pancreatic neck EUS FNA: Malignant cells present consistent with adenocarcinoma.  Mismatch repair proteins are ordered and will reported in an addendum.  Microsatellite instability test by PCR is also been ordered and will be reported separately.  Part A contain sufficient tissue for further testing.  05/11/2019 this is patient's first visit with [facility] oncology.  Husband Ron participated in person son Trey Paula who is a [employer], by phone.  Discussed biopsy results and also stage and prognosis of stage IV pancreatic cancer.  Talked about treatment options including FOLFIRINOX with growth factor, Gemzar and Abraxane, single agent Gemzar, no therapy/palliative care.  Patient is very good performance status.  Would suggest slightly dose reduced FOLFIRINOX with growth factor.  Patient will think about it but is willing to set things in motion for PowerPort placement and chemotherapy and teaching etc.  They plan to move here from [facility] in the near future.  05/14/2019 [place] CT chest multiple tiny bilateral pulmonary nodules suspicious for metastatic disease the likely 2 small for definitive PET/CT characterization or biopsy.  A follow-up chest CT in 2-3 months is suggested to assess for stability.  No thoracic adenopathy.  05/19/19: FOLFIRINOX education  05/20/19 Started modified FOLFIRINOX  05/26/19: Toxicity check: Tolerated first cycle relatively well. Minimal nausea and diarrhea. 1 liter of fluids in clinic today. Having back pain. Recommended trying some ibuprofen, heat and massage, reassess next week with Dr. Madison Hickman.  06/03/2019 cycle cycle 1 day 15 FOLFIRINOX appetite improving less back pain all very encouraging tumor  marker pending lab work looks good.  Proceed with cycle 1 day 15 see nurse in 2 weeks physician in 4 weeks imaging after 2 months  06/16/19  C3 FOLFIRINOX.  Tolerating therapy very well.  Tumor marker continues to decrease.  FU with Dr. Madison Hickman in 2 weeks.    07/08/19  C4 FOLFIRINOX.  Improved appetite.  CT prior to FU with Dr. Georgiann Hahn in 2 weeks.  Note that tumor marker is rising.   07/20/2019 [place] CT CAP: Questionable nonocclusive filling defect in partially visualized right IJ vein.  May be from mixing artifact versus small clot.  No thoracic adenopathy.  No significant change in scattered indeterminate bilateral pulmonary nodules.  No significant change in size of infiltrative pancreatic malignancy since 05/14/2019.  Persistent encasement.  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  Additional small nonocclusive thrombus in the left renal vein.  Dominant right hepatic metastasis are not significantly changed since 05/14/2019.  Smaller scattered hepatic metastasis are decreased in size.  No new abdominal pelvic mass or ascites.  07/21/2019 cycle 5 FOLFIRINOX with Neulasta support.  Platelets 99,000.  Will decrease oxaliplatin further to 50 mg/metered squared.  Discussed clot with patient.  No bleeding reported.  Will begin Xarelto.  Husband is on same medications.  Patient aware and discussed risks and benefits.  Will return in 2 in 4 weeks for additional chemo.  08/11/19  Pt is 1 week later for C6 FOLFIRINOX due to severe diarrhea last week.  Sx have resolved.  She will take Imodium prn.  FU with Dr. Madison Hickman in 2 weeks.    09/09/19  C7 FOLFIRINOX at continued dose reductions for low PLT and diarrhea.  FU in 2 and 4 weeks.  If tumor marker continues to rise, plan to scan in October.    09/23/19  C8 FOLFIRINOX.  Pt tolerating chemo exceptionally well.  Tumor marker is rising so will order CT CAP prior to FU with Dr. Madison Hickman in 2 weeks.    10/05/2019 [place] CT CAP: No thoracic adenopathy.  No appreciable change in indeterminate bilateral pulmonary nodules.  Slight interval decrease in size of pancreatic neoplasm.  Multifocal hepatic metastasis are overall increased in size and number from prior with one met decrease in size.  Persistent encasement and narrowing of the portal confluence with interval decrease in nonocclusive thrombus in the main portal vein and resolution of thrombus in the SMV.  No new abdominal pelvic lymphadenopathy or ascites.,,,,,, consider change to Gemzar Abraxane  10/06/2019 reviewed scans with patient which unfortunately show progressive disease. Talk about changing to Gemzar and Abraxane. After patient left realized we had not tested for mutations in BRCA1/PALB/NTRK/MSI stability. We will check into foundation 1 liquid CDX testing.  10/12/19  Gemzar/Abraxane education.  Start 10/15/19.  1 wk tox check.  Covid Moderna #3 today.    10/15/2019 cycle 1 day 1 Gemzar Abraxane  10/22/2019 cycle 1 day 8.  Tolerated very well.  Less nausea does have some slight constipation.  Will return next week and hope that counts are good for day 15 then see the nurse in 3 weeks for cycle 2-day 1.  Gave patient warning that we may go to day 1 day 8 skip 15 on a 21-day cycle if her counts do not allow this.  We will also need to order scan prior to cycle 3.  Tolerating well in general.  She  plans on hopes that her grandchildren can come trick-or-treating at the facility where she lives.  10/22/2019 foundation 1 liquid CDX: No reportable alterations with companion diagnostic claims.  Blood tumor mutational burden 1 mutation/megabyte, MSI high not detected, genomic findings CDK 4 amplification, FGF R2 amplification, KRAS G 12 D in 19.6%,,,,,Note that the test also included testing for BRCA1/BRCA2, ATM, and PALB 2 among others listed on page 21 of 25 of the report  10/29/19  Defer C2D8 due to PLT 50K.  Repeat CBC next week.    11/19/19  C2D1 Gemzar/Abraxane.  Skip day 8 next week due to Thanksgiving holiday.  Return in 2 weeks for C2D15.  CT CAP just prior to FU with Dr. Madison Hickman in 4 weeks.        Active therapy Gemzar and Abraxane, and Xarelto    Conception Oms

## 2019-12-03 ENCOUNTER — Encounter: Admit: 2019-12-03 | Discharge: 2019-12-03 | Payer: MEDICARE

## 2019-12-03 DIAGNOSIS — C259 Malignant neoplasm of pancreas, unspecified: Secondary | ICD-10-CM

## 2019-12-03 DIAGNOSIS — G20 Parkinson's disease: Secondary | ICD-10-CM

## 2019-12-03 DIAGNOSIS — M549 Dorsalgia, unspecified: Secondary | ICD-10-CM

## 2019-12-03 LAB — CA19.9: Lab: 351 U/mL — ABNORMAL HIGH (ref <35)

## 2019-12-03 MED ORDER — PACLITAXEL PBP IVPB
125 mg/m2 | Freq: Once | INTRAVENOUS | 0 refills | Status: CP
Start: 2019-12-03 — End: ?
  Administered 2019-12-03: 20:00:00 200 mg via INTRAVENOUS

## 2019-12-03 MED ORDER — HEPARIN, PORCINE (PF) 100 UNIT/ML IV SYRG
500 [IU] | Freq: Once | 0 refills | Status: CP
Start: 2019-12-03 — End: ?

## 2019-12-03 MED ORDER — ONDANSETRON 8 MG PO TBDI
16 mg | Freq: Once | ORAL | 0 refills | Status: CP
Start: 2019-12-03 — End: ?
  Administered 2019-12-03: 20:00:00 16 mg via ORAL

## 2019-12-03 MED ORDER — GEMCITABINE IVPB
1000 mg/m2 | Freq: Once | INTRAVENOUS | 0 refills | Status: CP
Start: 2019-12-03 — End: ?
  Administered 2019-12-03 (×3): 1639.9 mg via INTRAVENOUS

## 2019-12-03 NOTE — Patient Instructions
Call Immediately to report the following:  Uncontrolled nausea and/or vomiting, uncontrolled pain, or unusual bleeding.  Temperature of 100.4 F or greater and/or any sign/symptom of infection (redness, warmth, tenderness)  Painful mouth or difficulty swallowing  Red, cracked, or painful hands and/or feet  Diarrhea   Swelling of arms or legs  Rash    Important Phone Numbers:  [facility] Main Number (answered 24 hours a day) [phone]  Cancer Center Scheduling (appointments) [phone] OR [phone]  [facility] (for nutritional supplements) [phone]        Port Maintenance - If you have a port, it should be flushed every 6-8 weeks when not in use.  Please check with your MD, nurse, or the scheduler.

## 2019-12-03 NOTE — Progress Notes
CHEMO NOTE  Verified chemo consent signed and in chart.    Verified initiate chemo order in O2    Blood return positive via: Port (Single)    BSA and dose double checked (agree with orders as written) with: yes with Val, RN    Labs/applicable tests checked: CBC and Comprehensive Metabolic Panel (CMP)    Chemo regime: C2D15 Abraxane, Gemzar    Rate verified and armband double checkwith second RN: yes    Patient education offered and stated understanding. Denies questions at this time.      Patient presented to clinic for treatment. Patient's port accessed with positive blood return. Patient's labs okay to treat. Patient tolerated infusion well. Patient's port flushed with heparin and de-accessed per protocol. Patient discharged off unit via ambulation in good condition.

## 2019-12-14 ENCOUNTER — Ambulatory Visit: Admit: 2019-12-14 | Discharge: 2019-12-14 | Payer: MEDICARE

## 2019-12-14 ENCOUNTER — Encounter: Admit: 2019-12-14 | Discharge: 2019-12-14 | Payer: MEDICARE

## 2019-12-14 DIAGNOSIS — C259 Malignant neoplasm of pancreas, unspecified: Secondary | ICD-10-CM

## 2019-12-14 MED ORDER — PACLITAXEL PBP IVPB
125 mg/m2 | Freq: Once | INTRAVENOUS | 0 refills
Start: 2019-12-14 — End: ?

## 2019-12-14 MED ORDER — GEMCITABINE IVPB
1000 mg/m2 | Freq: Once | INTRAVENOUS | 0 refills
Start: 2019-12-14 — End: ?

## 2019-12-14 MED ORDER — ONDANSETRON 8 MG PO TBDI
16 mg | Freq: Once | ORAL | 0 refills
Start: 2019-12-14 — End: ?

## 2019-12-14 MED ORDER — SODIUM CHLORIDE 0.9 % IJ SOLN
50 mL | Freq: Once | INTRAVENOUS | 0 refills | Status: CP
Start: 2019-12-14 — End: ?
  Administered 2019-12-14: 17:00:00 50 mL via INTRAVENOUS

## 2019-12-14 MED ORDER — IOHEXOL 350 MG IODINE/ML IV SOLN
100 mL | Freq: Once | INTRAVENOUS | 0 refills | Status: CP
Start: 2019-12-14 — End: ?
  Administered 2019-12-14: 17:00:00 100 mL via INTRAVENOUS

## 2019-12-14 MED ORDER — HEPARIN, PORCINE (PF) 100 UNIT/ML IV SYRG
500 [IU] | Freq: Once | 0 refills | Status: CP
Start: 2019-12-14 — End: ?

## 2019-12-16 ENCOUNTER — Encounter: Admit: 2019-12-16 | Discharge: 2019-12-16 | Payer: MEDICARE

## 2019-12-17 ENCOUNTER — Encounter: Admit: 2019-12-17 | Discharge: 2019-12-17 | Payer: MEDICARE

## 2019-12-17 DIAGNOSIS — C259 Malignant neoplasm of pancreas, unspecified: Secondary | ICD-10-CM

## 2019-12-17 DIAGNOSIS — M549 Dorsalgia, unspecified: Secondary | ICD-10-CM

## 2019-12-17 DIAGNOSIS — G20 Parkinson's disease: Secondary | ICD-10-CM

## 2019-12-17 MED ORDER — HEPARIN, PORCINE (PF) 100 UNIT/ML IV SYRG
500 [IU] | Freq: Once | 0 refills | Status: CP
Start: 2019-12-17 — End: ?

## 2019-12-17 NOTE — Patient Instructions
Call Immediately to report the following:  Uncontrolled nausea and/or vomiting, uncontrolled pain, or unusual bleeding.  Temperature of 100.4 F or greater and/or any sign/symptom of infection (redness, warmth, tenderness)  Painful mouth or difficulty swallowing  Red, cracked, or painful hands and/or feet  Diarrhea   Swelling of arms or legs  Rash    Important Phone Numbers:  [facility] Main Number (answered 24 hours a day) [phone]  Cancer Center Scheduling (appointments) [phone] OR [phone]  [facility] (for nutritional supplements) [phone]        Port Maintenance - If you have a port, it should be flushed every 6-8 weeks when not in use.  Please check with your MD, nurse, or the scheduler.        For up to date information on the COVID-19 virus, visit the CDC website. https://www.cdc.gov/coronavirus   General supportive care during cold and flu season and infection prevention reminders:    o Wash hands often with soap and water for at least 20 seconds   o Cover your mouth and nose   o Social distancing: try to maintain 6 feet between you and other people   o Stay home if sick and symptoms mild or manageable?   If you must be around people wear a mask     If you are having symptoms of a lower respiratory infection (cough, shortness of breath) and/or fever AND either traveled in last 30 days (internationally or to region of exposure) OR known exposure to patient with COVID19:     o Call your primary care provider for questions or health needs.    Tell your doctor about your recent travel and your symptoms     o In a medical emergency, call 911 or go to the nearest emergency room.

## 2019-12-17 NOTE — Progress Notes
Name: Cassandra Wallace          MRN: [id number]      DOB: January 10, 1945      AGE: 74 y.o.   DATE OF SERVICE: 12/17/2019    Subjective:             Reason for Visit:  Follow Up      Cassandra Wallace is a 74 y.o. female.     Cancer Staging  Pancreatic cancer metastasized to liver [facility])  Staging form: Exocrine Pancreas, AJCC 8th Edition  - Clinical stage from 05/09/2019: Stage IV (cT4, cN0, pM1) - Signed by Lady Deutscher, MD on 05/09/2019      History of Present Illness  Lacy is here today in FU of her metastatic pancreatic cancer and to review recent CT scans.??She has a history of abdominal pain, constipation and unintentional weight loss. ?Unfortunately a CT ?abdomen and pelvis done in [place] showed pancreatic lesions as well as 3 right hepatic lesions. ?An MRI saw the same lesions. ?An EUS performed on May 07, 2019 at [place] found to abnormal lesions in the left lobe of the liver and a pancreatic mass in the neck the pancreas that was invading the superior mesenteric artery. Biopsies of both came back consistent with adenocarcinoma. A CA 19-9 came back as [name] which is elevated.??  ?  --?Started?FOLFIRINOX ?on 05/20/19.  ?  -- CT on 07/20/19 showed stable disease. ?Incidental finding of portal vein thrombus. ?Pt started on Xarelto. ?  ?  -- CT on 10/05/19 saw several new and enlarging liver lesions.??  ?  -- Started Gemzar/Abraxane on 10/15/19    --  CT CAP 12/14/19 showed new and enlarging bilobar liver mets.    ?  Interim hx:   No new sx.  Rev'd recent CT showing liver progression.  Pt's son listened in by conference call.       Review of Systems   Constitutional: Positive for fatigue.   HENT: Negative for mouth sores.    Respiratory: Negative.  Negative for cough and shortness of breath.    Cardiovascular: Negative.  Negative for leg swelling.   Gastrointestinal: Positive for constipation and diarrhea. Negative for nausea.        Manageable GI sx     Genitourinary: Negative.    Musculoskeletal: Negative.    Skin: Negative.    Neurological: Positive for light-headedness (positional) and numbness (tingly toes).   Hematological: Negative.    Psychiatric/Behavioral: Negative.    All other systems reviewed and are negative.        Objective:         ? carbidopa/levodopa (SINEMET) 25/100 mg tablet Take 1 tablet by mouth three times daily.   ? lidocaine/prilocaine (EMLA) 2.5/2.5 % topical cream Apply a thin film over port one hour prior to treatment. Do not rub in. Cover with plastic wrap after application   ? loperamide (IMODIUM A-D) 2 mg capsule Take 2 capsules by mouth after first loose/frequent bowel movement, then 1 capsule every 2 hours (2 capsules every 4 hours at night) until 12 hours have passed without a bowel movement.   ? ondansetron HCL (ZOFRAN) 8 mg tablet Take one tablet by mouth every 8 hours as needed for Nausea.   ? rivaroxaban (XARELTO) 20 mg tablet Take one tablet by mouth daily. Take with food.     Vitals:    12/17/19 1125   BP: (!) 148/100   BP Source: Arm, Right Upper   Patient Position: Sitting  Pulse: 103   Resp: 16   Temp: 36.6 ?C (97.9 ?F)   TempSrc: Oral   SpO2: 100%   Weight: 63.3 kg (139 lb 9.6 oz)   Height: 161.3 cm (63.5)   PainSc: Zero     Body mass index is 24.34 kg/m?Marland Kitchen     Pain Score: Zero       Fatigue Scale: 4    Pain Addressed:  N/A    Patient Evaluated for a Clinical Trial: No treatment clinical trial available for this patient.     [name] Cooperative Oncology Group performance status is 0, Fully active, able to carry on all pre-disease performance without restriction.Marland Kitchen     Physical Exam  Vitals reviewed.   Constitutional:       Appearance: Normal appearance.   HENT:      Head: Normocephalic.   Eyes:      Extraocular Movements: Extraocular movements intact.   Cardiovascular:      Rate and Rhythm: Normal rate.   Pulmonary:      Effort: Pulmonary effort is normal.   Musculoskeletal:         General: Normal range of motion.      Cervical back: Normal range of motion.   Skin:     General: Skin is warm and dry.   Neurological:      General: No focal deficit present.      Mental Status: She is alert and oriented to person, place, and time.   Psychiatric:         Mood and Affect: Mood normal.         Behavior: Behavior normal.         Thought Content: Thought content normal.           Results for orders placed or performed during the hospital encounter of 12/17/19 (from the past 336 hour(s))   CBC AND DIFF   Result Value Ref Range    White Blood Cells 6.8 4.5 - 11.0 K/UL    RBC 4.11 4.0 - 5.0 M/UL    Hemoglobin 12.0 12.0 - 15.0 GM/DL    Hematocrit 16.1 36 - 45 %    MCV 91.2 80 - 100 FL    MCH 29.2 26 - 34 PG    MCHC 32.0 32.0 - 36.0 G/DL    RDW 09.6 (H) 11 - 15 %    Platelet Count 187 150 - 400 K/UL    MPV 8.5 7 - 11 FL    Neutrophils 56 41 - 77 %    Lymphocytes 15 (L) 24 - 44 %    Monocytes 27 (H) 4 - 12 %    Eosinophils 1 0 - 5 %    Basophils 1 0 - 2 %    Absolute Neutrophil Count 3.90 1.8 - 7.0 K/UL    Absolute Lymph Count 1.00 1.0 - 4.8 K/UL    Absolute Monocyte Count 1.80 (H) 0 - 0.80 K/UL    Absolute Eosinophil Count 0.10 0 - 0.45 K/UL    Absolute Basophil Count 0.00 0 - 0.20 K/UL    Platelet Estimate NORMAL        12/14/19 CT CAP:  IMPRESSION     CHEST:   1. ?Several stable subcentimeter bilateral pulmonary nodules which are   indeterminate, but likely benign given continued stability dating back to   May 2021.     2. ?No thoracic lymphadenopathy.     ABDOMEN AND PELVIS:   1. ?No substantial change in  infiltrative pancreatic malignancy with   extensive vascular involvement.     2. ?New and enlarging bilobar hepatic metastases.     3. ?Development of nonspecific trace pelvic ascites and mild peritoneal   thickening.     4. ?Unchanged small peripancreatic subcentimeter lymph nodes. No new   abdominal pelvic lymphadenopathy.     5. ?Resolved left renal vein thrombus.      Assessment and Plan:       Patient Active Problem List    Diagnosis Date Noted   ? 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  Patient aware to call if bleeding or unusual bruising.  Has been on same medication and is fairly familiar.     ? Dehydration 05/26/2019   ? 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 CINV (chemotherapy-induced nausea and vomiting) 05/26/2019   ? Dizziness 05/26/2019   ? Diarrhea 05/26/2019   ? Parkinson's disease (HCC) 05/11/2019   ? Pancreatic cancer metastasized to liver [facility]) 05/07/2019     04/13/2019 [facility] medical [facility] CT abdomen pelvis with contrast: Abdominal pain constipation unintentional weight loss.  The head of the pancreas is 3 hypodense lesions.  The lesion towards the right lateral side measures 1.8 cm.  Anterior lesion the mild/mid head measures 11.4 cm.  There are 3 right hepatic lesions that are suspicious.  Impression: Multifocal pancreas lesion suspicious for tumor such as adenocarcinoma.  Could also be focal inflammation from subacute or chronic pancreatitis.  04/20/2019 [place] medical imaging [facility] MRI abdomen without and with contrast.  Indeterminate lesions in the pancreatic bed and proximal body.  Pancreatic neoplasm cannot be excluded.  This is compared to CT/12/2019.  Several right and left hepatic lesions are present.  The 2 largest are adjacent to 1 another.  Measuring 2.1 x 1.8 cm.  Concerning for hepatic metastatic lesions.  Heterogeneous process pancreatic head and proximal body the whole area measures approximately 5.2 x 3.5 cm.  It appears to be least some encasement of the associated narrowing of the superior mesenteric vein  04/23/2019 [facility] clinic note.  Patient had Covid in November 2020.  Recent imaging showed spots on pancreas.  History of Parkinson's weight 156 pounds  05/07/2019 [place] EUS report examination of the left lobe of the liver revealed 2 lesions measuring 1 cm each.  FNA x4 was performed upper limit result is positive for malignant cells.  Examination of pancreatic revealed a mass in the neck the pancreas with invasion to SMA.  Fine-needle biopsy was obtained and preliminary is all is positive for malignant cells.  Await cytology results and await path results.  05/07/2019 CA 19-9 1782, H 12.3, platelets 120 low, W3.5 low, chemistries normal,  05/07/2019  pathology/cytology liver US-FNA malignant cells present consistent with adenocarcinoma.  Pancreatic neck EUS FNA: Malignant cells present consistent with adenocarcinoma.  Mismatch repair proteins are ordered and will reported in an addendum.  Microsatellite instability test by PCR is also been ordered and will be reported separately.  Part A contain sufficient tissue for further testing.  05/11/2019 this is patient's first visit with  [facility] oncology.  Husband Ron participated in person son Trey Paula who is a [employer], by phone.  Discussed biopsy results and also stage and prognosis of stage IV pancreatic cancer.  Talked about treatment options including FOLFIRINOX with growth factor, Gemzar and Abraxane,  single agent Gemzar, no therapy/palliative care.  Patient is very good performance status.  Would suggest slightly dose reduced FOLFIRINOX with growth factor.  Patient will think about it but is willing to set things in motion for [employer] placement and chemotherapy and teaching etc.  They plan to move here from [facility] in the near future.  05/14/2019 [place] CT chest multiple tiny bilateral pulmonary nodules suspicious for metastatic disease the likely 2 small for definitive PET/CT characterization or biopsy.  A follow-up chest CT in 2-3 months is suggested to assess for stability.  No thoracic adenopathy.  05/19/19: FOLFIRINOX education  05/20/19 Started modified FOLFIRINOX  05/26/19: Toxicity check: Tolerated first cycle relatively well. Minimal nausea and diarrhea. 1 liter of fluids in clinic today. Having back pain. Recommended trying some ibuprofen, heat and massage, reassess next week with Dr. Madison Hickman.  06/03/2019 cycle cycle 1 day 15 FOLFIRINOX appetite improving less back pain all very encouraging tumor marker pending lab work looks good.  Proceed with cycle 1 day 15 see nurse in 2 weeks physician in 4 weeks imaging after 2 months  06/16/19  C3 FOLFIRINOX.  Tolerating therapy very well.  Tumor marker continues to decrease.  FU with Dr. Madison Hickman in 2 weeks.    07/08/19  C4 FOLFIRINOX.  Improved appetite.  CT prior to FU with Dr. Georgiann Hahn in 2 weeks.  Note that tumor marker is rising.   07/20/2019 [place] CT CAP: Questionable nonocclusive filling defect in partially visualized right IJ vein.  May be from mixing artifact versus small clot.  No thoracic adenopathy.  No significant change in scattered indeterminate bilateral pulmonary nodules.  No significant change in size of infiltrative pancreatic malignancy since 05/14/2019.  Persistent encasement.  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  Additional small nonocclusive thrombus in the left renal vein.  Dominant right hepatic metastasis are not significantly changed since 05/14/2019.  Smaller scattered hepatic metastasis are decreased in size.  No new abdominal pelvic mass or ascites.  07/21/2019 cycle 5 FOLFIRINOX with Neulasta support.  Platelets 99,000.  Will decrease oxaliplatin further to 50 mg/metered squared.  Discussed clot with patient.  No bleeding reported.  Will begin Xarelto.  Husband is on same medications.  Patient aware and discussed risks and benefits.  Will return in 2 in 4 weeks for additional chemo.  08/11/19  Pt is 1 week later for C6 FOLFIRINOX due to severe diarrhea last week.  Sx have resolved.  She will take Imodium prn.  FU with Dr. Madison Hickman in 2 weeks.    09/09/19  C7 FOLFIRINOX at continued dose reductions for low PLT and diarrhea.  FU in 2 and 4 weeks.  If tumor marker continues to rise, plan to scan in October.    09/23/19  C8 FOLFIRINOX.  Pt tolerating chemo exceptionally well.  Tumor marker is rising so will order CT CAP prior to FU with Dr. Madison Hickman in 2 weeks.    10/05/2019 [place] CT CAP: No thoracic adenopathy.  No appreciable change in indeterminate bilateral pulmonary nodules.  Slight interval decrease in size of pancreatic neoplasm.  Multifocal hepatic metastasis are overall increased in size and number from prior with one met decrease in size.  Persistent encasement and narrowing of the portal confluence with interval decrease in nonocclusive thrombus in the main portal vein and resolution of thrombus in the SMV.  No new abdominal pelvic lymphadenopathy or  ascites.,,,,,, consider change to Gemzar Abraxane  10/06/2019 reviewed scans with patient which unfortunately show progressive disease. Talk about changing to Gemzar and Abraxane. After patient left realized we had not tested for mutations in BRCA1/PALB/NTRK/MSI stability. We will check into foundation 1 liquid CDX testing.  10/12/19  Gemzar/Abraxane education.  Start 10/15/19.  1 wk tox check.  Covid Moderna #3 today.    10/15/2019 cycle 1 day 1 Gemzar Abraxane  10/22/2019 cycle 1 day 8.  Tolerated very well.  Less nausea does have some slight constipation.  Will return next week and hope that counts are good for day 15 then see the nurse in 3 weeks for cycle 2-day 1.  Gave patient warning that we may go to day 1 day 8 skip 15 on a 21-day cycle if her counts do not allow this.  We will also need to order scan prior to cycle 3.  Tolerating well in general.  She plans on hopes that her grandchildren can come trick-or-treating at the facility where she lives.  10/22/2019 foundation 1 liquid CDX: No reportable alterations with companion diagnostic claims.  Blood tumor mutational burden 1 mutation/megabyte, MSI high not detected, genomic findings CDK 4 amplification, FGF R2 amplification, KRAS G 12 D in 19.6%,,,,,Note that the test also included testing for BRCA1/BRCA2, ATM, and PALB 2 among others listed on page 21 of 25 of the report  10/29/19  Defer C2D8 due to PLT 50K.  Repeat CBC next week.    11/19/19  C2D1 Gemzar/Abraxane.  Skip day 8 next week due to Thanksgiving holiday.  Return in 2 weeks for C2D15.  CT CAP just prior to FU with Dr. Madison Hickman in 4 weeks.    12/14/2019 [place] CT CAP: Several stable subcentimeter bilateral pulmonary nodules are indeterminate.  No thoracic adenopathy.  No substantial change in infiltrative pancreatic malignancy with extensive vascular involvement.  New and enlarging bilobar hepatic metastasis.  Development of nonspecific trace pelvic ascites.  Unchanged small peripancreatic subcentimeter lymph nodes.  Resolved left renal vein thrombosis.  12/17/19  Discussed CT scan showing progressive liver disease w/ the pt and her son.  DC Gem/Abraxane.  Consult [facility] for clinical trial otherwise, start liposomal irinotecan/CIVI 5FU/LV soon.      Active therapy Gemzar and Abraxane, and Xarelto    [place]              Pancreatic cancer w/ liver mets. Liver progression on recent CT.  Discussed with pt and her son.  DC Abraxane/Gemzar.  Consult [facility] for second opinion regarding treatment options and available clinical trials.  Considering liposomal irinotencan/CIVI 5FU/LV as next line of therapy.  Will await [facility] recommendation before scheduling FU appts.

## 2019-12-17 NOTE — Progress Notes
Pt here for nurse draw/follow up/treatment.  Port accessed easily with brisk blood return.  Specimen obtained for lab.  Pt was seen by Shaaron Adler, NP.  No treatment will be given  today due to progression.  Port flushed and deaccessed.  Dismissed by wheelchair due to  light-headedness when standing.

## 2019-12-23 ENCOUNTER — Encounter: Admit: 2019-12-23 | Discharge: 2019-12-23 | Payer: MEDICARE

## 2019-12-23 DIAGNOSIS — G20 Parkinson's disease: Secondary | ICD-10-CM

## 2019-12-23 DIAGNOSIS — C787 Secondary malignant neoplasm of liver and intrahepatic bile duct: Secondary | ICD-10-CM

## 2019-12-23 DIAGNOSIS — R5383 Other fatigue: Secondary | ICD-10-CM

## 2019-12-23 DIAGNOSIS — M549 Dorsalgia, unspecified: Secondary | ICD-10-CM

## 2019-12-23 DIAGNOSIS — C259 Malignant neoplasm of pancreas, unspecified: Secondary | ICD-10-CM

## 2019-12-23 DIAGNOSIS — C250 Malignant neoplasm of head of pancreas: Secondary | ICD-10-CM

## 2019-12-23 NOTE — Progress Notes
Per Dr Milford Cage email to pre-screening team for possible study options.

## 2019-12-23 NOTE — Progress Notes
Date of Service: 12/23/2019      Subjective:             Reason for Visit:  New CA Pt      Cassandra Wallace is a 74 y.o. female here for advanced pancreatic cancer an possible participation into a clinical trial.    Cancer Staging  Pancreatic cancer metastasized to liver [facility])  Staging form: Exocrine Pancreas, AJCC 8th Edition  - Clinical stage from 05/09/2019: Stage IV (cT4, cN0, pM1) - Signed by Lady Deutscher, MD on 05/09/2019      History of Present Illness  She is a 74 y.o. female who presented with abdominal pain, constipation and unintentional weight loss. The patient used to live in [place].  On 04/13/19, CT ?abdomen and pelvis done in [facility] showed pancreatic lesions as well as 3 right hepatic lesions. ?An MRI saw the same lesions. She moved to [place] to be closer to her son.  ?An EUS performed on May 07, 2019 at [place] found to abnormal lesions in the left lobe of the liver and a pancreatic mass in the neck the pancreas that was invading the superior mesenteric artery. Biopsies were consistent with adenocarcinoma. A CA 19-9 was 1782.   ?  --?Started?FOLFIRINOX ?on 05/20/19.  ?  -- CT on 07/20/19 showed stable disease. ?Incidental finding of portal vein thrombus. ?Pt started on Xarelto. ?  ?  -- CT on 10/05/19 saw several new and enlarging liver lesions.??  ?  -- Started Gemzar/Abraxane on 10/15/19    --  CT CAP 12/14/19 showed new and enlarging bilobar liver mets.    ?          12/23/19: She is here for this visit, sent to [place] by Dr. Georgiann Hahn for possible participation into a clinical trial. She came accompanied by her son.  She reports that earlier she felt lightheaded but after she drank water she feels better.  She has not been very active in the last 2 weeks and she has attributes this to her chemotherapy.  She has fatigue.  Her appetite is fair.  She does not think she is losing weight.  She has pain in the right upper quadrant area but describes this to be mild and under control.  She has occasional constipation alternated by diarrhea.         Review of Systems    Medical History:   Diagnosis Date   ? Back pain    ? Pancreatic cancer (HCC)     mets to liver   ? Parkinson's disease (HCC) 2016     Surgical History:   Procedure Laterality Date   ? CATARACT REMOVAL Bilateral 2020   ? ESOPHAGOGASTRODUODENOSCOPY WITH TRANSENDOSCOPIC ULTRASOUND GUIDED FINE NEEDLE ASPIRATION/ BIOPSY N/A 05/07/2019    Performed by Comer Locket, MD at [facility] ENDO     Family History   Problem Relation Age of Onset   ? Cancer-Uterine Mother    ? Arthritis-rheumatoid Father      Social History     Socioeconomic History   ? Marital status: Married     Spouse name: Not on file   ? Number of children: Not on file   ? Years of education: Not on file   ? Highest education level: Not on file   Occupational History   ? Not on file   Tobacco Use   ? Smoking status: Never Smoker   ? Smokeless tobacco: Never Used   Vaping Use   ?  Vaping Use: Never used   Substance and Sexual Activity   ? Alcohol use: Not Currently   ? Drug use: Never   ? Sexual activity: Not on file   Other Topics Concern   ? Not on file   Social History Narrative   ? Not on file         Objective:         ? carbidopa/levodopa (SINEMET) 25/100 mg tablet Take 1 tablet by mouth three times daily.   ? lidocaine/prilocaine (EMLA) 2.5/2.5 % topical cream Apply a thin film over port one hour prior to treatment. Do not rub in. Cover with plastic wrap after application   ? loperamide (IMODIUM A-D) 2 mg capsule Take 2 capsules by mouth after first loose/frequent bowel movement, then 1 capsule every 2 hours (2 capsules every 4 hours at night) until 12 hours have passed without a bowel movement.   ? ondansetron HCL (ZOFRAN) 8 mg tablet Take one tablet by mouth every 8 hours as needed for Nausea.   ? rivaroxaban (XARELTO) 20 mg tablet Take one tablet by mouth daily. Take with food.     Vitals:    12/23/19 1107   BP: 115/75   BP Source: Arm, Left Upper   Patient Position: Sitting Pulse: 107   Resp: 18   Temp: 36.6 ?C (97.9 ?F)   TempSrc: Oral   SpO2: 100%   Weight: 64.6 kg (142 lb 6.7 oz)   Height: 161.3 cm (63.5)   PainSc: Seven     Body mass index is 24.83 kg/m?Marland Kitchen     Pain Score: Seven  Pain Loc: Back      Pain Addressed:  Current regimen working to control pain.    Patient Evaluated for a Clinical Trial: Patient not eligible for a treatment trial (including not needing treatment, needs palliative care, in remission). and No treatment clinical trial available for this patient.     [name] Cooperative Oncology Group performance status is 3, Capable of only limited selfcare, confined to bed or chair more than 50% of waking hours.     Physical Exam  Constitutional:       General: She is not in acute distress.     Appearance: She is ill-appearing. She is not toxic-appearing.   HENT:      Head: Normocephalic.      Comments: wears a wig     Mouth/Throat:      Pharynx: Oropharynx is clear.   Eyes:      General: No scleral icterus.        Right eye: No discharge.         Left eye: No discharge.   Cardiovascular:      Rate and Rhythm: Tachycardia present.   Pulmonary:      Effort: No respiratory distress.      Breath sounds: No wheezing or rales.   Abdominal:      General: There is distension.      Palpations: There is no mass.      Tenderness: There is no abdominal tenderness. There is no guarding.   Musculoskeletal:      Right lower leg: Edema (trace) present.      Left lower leg: Edema (trace) present.   Lymphadenopathy:      Cervical: No cervical adenopathy.   Skin:     Coloration: Skin is pale.   Neurological:      Mental Status: She is alert and oriented to person, place, and time.  Psychiatric:         Behavior: Behavior normal.         Thought Content: Thought content normal.         Judgment: Judgment normal.      Comments: flat affect            CBC w/DIFF  CBC with Diff Latest Ref Rng & Units 12/17/2019 12/03/2019 11/19/2019 11/05/2019 10/29/2019   WBC 4.5 - 11.0 K/UL 6.8 6.7 6.0 4.4(L) 2.5(L)   RBC 4.0 - 5.0 M/UL 4.11 3.80(L) 3.56(L) 3.43(L) 3.52(L)   HGB 12.0 - 15.0 GM/DL 16.1 11.5(L) 11.1(L) 10.9(L) 11.1(L)   HCT 36 - 45 % 37.5 36.0 34.7(L) 33.4(L) 34.4(L)   MCV 80 - 100 FL 91.2 94.6 97.4 97.4 97.5   MCH 26 - 34 PG 29.2 30.2 31.2 31.8 31.4   MCHC 32.0 - 36.0 G/DL 09.6 31.9(L) 32.0 32.6 32.2   RDW 11 - 15 % 15.9(H) 15.0 15.0 15.6(H) 14.7   PLT 150 - 400 K/UL 187 229 131(L) 229 50(L)   MPV 7 - 11 FL 8.5 9.7 9.8 8.5 8.6   NEUT 41 - 77 % 56 63 61 53 55   ANC 1.8 - 7.0 K/UL 3.90 4.20 3.70 2.40 1.40(L)   LYMA 24 - 44 % 15(L) 13(L) 15(L) 19(L) 33   ALYM 1.0 - 4.8 K/UL 1.00 0.90(L) 0.90(L) 0.80(L) 0.80(L)   MONA 4 - 12 % 27(H) 20(H) 17(H) 22(H) 9   AMONO 0 - 0.80 K/UL 1.80(H) 1.30(H) 1.00(H) 1.00(H) 0.20   EOSA 0 - 5 % 1 3 6(H) 5 3   AEOS 0 - 0.45 K/UL 0.10 0.20 0.30 0.20 0.10   BASA 0 - 2 % 1 1 1 1  0   ABAS 0 - 0.20 K/UL 0.00 0.10 0.00 0.10 0.00     Comprehensive Metabolic Profile  CMP Latest Ref Rng & Units 12/17/2019 12/03/2019 11/19/2019 11/05/2019 10/29/2019   NA 137 - 147 MMOL/L 138 137 139 141 140   K 3.5 - 5.1 MMOL/L 3.6 4.0 4.0 4.1 3.7   CL 98 - 110 MMOL/L 101 103 105 106 104   CO2 21 - 30 MMOL/L 24 23 26 26 25    GAP 3 - 12 13(H) 11 8 9 11    BUN 7 - 25 MG/DL 15 16 15 14 13    CR 0.4 - 1.00 MG/DL 0.45 4.09 8.11 9.14 7.82   GLUX 70 - 100 MG/DL 956(O) 130(Q) 657(Q) 469(G) 117(H)   CA 8.5 - 10.6 MG/DL 9.4 9.1 9.1 9.2 29.5   TP 6.0 - 8.0 G/DL 6.2 6.1 2.8(U) 6.0 6.6   ALB 3.5 - 5.0 G/DL 3.3(L) 3.4(L) 3.4(L) 3.4(L) 3.6   ALKP 25 - 110 U/L 240(H) 191(H) 198(H) 189(H) 160(H)   ALT 7 - 56 U/L 8 16 18 10 23    TBILI 0.3 - 1.2 MG/DL 1.0 0.5 0.4 0.4 0.4   GFR >60 mL/min - >60 >60 >60 >60   GFRAA >60 mL/min - >60 >60 >60 >60       Tumor Markers  Lab Results   Component Value Date    CA199 3,514 (H) 12/03/2019    CA199 2,957 (H) 11/05/2019    CA199 2,523 (H) 10/29/2019    CA199 2,990 (H) 10/06/2019    CA199 2,395 (H) 09/23/2019    CA199 2,524 (H) 09/09/2019    CA199 2,487 (H) 08/11/2019    CA199 2,328 (H) 07/21/2019    CA199 3,300 (H) 07/08/2019    CA199  3,416 (H) 06/16/2019    CA125 18 10/12/2019       Radiologic Examinations:  CT ABD/PELV W CONTRAST  Narrative: CT CHEST, ABDOMEN AND PELVIS    Clinical Indication:  74 years old; metastatic pancreas cancer.    Technique: Multiple contiguous axial images were obtained through the chest, abdomen and pelvis following the administration of IV contrast material. Arterial and portal venous phases of post contrast imaging were obtained. Post processing coronal and sagittal reconstruction images were made from the axial images.      IV contrast: Yes  Bowel contrast:  None    Comparison: CT CAP 10/05/2019    CHEST FINDINGS:  Lower Neck: Unremarkable    Lymph nodes: No thoracic lymphadenopathy.     Heart, Mediastinum, and Great Vessels: Heart size is normal without pericardial effusion. Mild thoracic calcifications. Right internal jugular central venous port catheter with tip at the superior cavoatrial junction.    Airway, Lungs and Pleura: Unchanged small bilateral pulmonary nodules measuring up to 0.5 cm including a representative right upper lobe pulmonary nodule (series 7, image 23). Several unchanged fissural lymph nodes. New small right pleural effusion. No definite pleural nodularity. Associated subsegmental atelectasis. No pneumothorax. Central airways are patent.    Chest Wall and Osseous Structures: No destructive osseous lesions.    ABDOMEN AND PELVIS FINDINGS:    Liver and Biliary system: Enlarging and new numerous bilobar hepatic metastases. Previously measured hepatic dome mass measures up to 5.1 cm, previously 3.3 cm (series 6, image 8). Previously measured segment IVb lesion measures up to 3.3 cm, previously 2.2 cm. Previously measured caudate mass measures 3.2 cm, previously 1.2 cm (series 6, image 19). Major portal veins are narrowed, but patent within the liver. Gallbladder is unremarkable. No bile duct dilation.    Pancreas and Retroperitoneum: No substantial change in size of an ill-defined, infiltrative pancreatic head malignancy which occludes the portal splenic confluence and the proximal superior mesenteric vein with multiple collateral vessels. Persistent encasement of the superior mesenteric artery and celiac trifurcation is not substantially changed. Previously noted left renal vein thrombus has resolved. Distal pancreatic atrophy again noted.     Spleen: Unremarkable.    Adrenal Glands and Kidneys: Both adrenal glands are unremarkable. There is no hydronephrosis.    Pelvis: Bladder is partially decompressed. The uterus is unremarkable.    Bowel, Mesentery and Peritoneal space: Development of small volume ascites in the pelvis. No pneumoperitoneum. Mild colonic diverticulosis. Appendix is normal. Unchanged mild peritoneal thickening. No discrete omental or peritoneal nodules.    Aorta and Major Vessels: Moderate vascular calcifications. Normal caliber abdominal aorta.     Lymph nodes: Unchanged small subcentimeter peripancreatic lymph nodes no new abdominal, retroperitoneal, or or pelvic lymphadenopathy.    Abdominal wall and Osseous Structures: No destructive osseous lesions.  Impression: CHEST:  1.  Several stable subcentimeter bilateral pulmonary nodules which are indeterminate, but likely benign given continued stability dating back to May 2021.    2.  No thoracic lymphadenopathy.    ABDOMEN AND PELVIS:  1.  No substantial change in infiltrative pancreatic malignancy with extensive vascular involvement.    2.  New and enlarging bilobar hepatic metastases.    3.  Development of nonspecific trace pelvic ascites and mild peritoneal thickening.    4.  Unchanged small peripancreatic subcentimeter lymph nodes. No new abdominal pelvic lymphadenopathy.    5.  Resolved left renal vein thrombus.     Finalized by Elsie Amis, MD on 12/14/2019 12:05 PM. Dictated by  Elsie Amis, MD on 12/14/2019 11:19 AM.  CT CHEST W CONTRAST  Narrative: CT CHEST, ABDOMEN AND PELVIS    Clinical Indication:  74 years old; metastatic pancreas cancer.    Technique: Multiple contiguous axial images were obtained through the chest, abdomen and pelvis following the administration of IV contrast material. Arterial and portal venous phases of post contrast imaging were obtained. Post processing coronal and sagittal reconstruction images were made from the axial images.      IV contrast: Yes  Bowel contrast:  None    Comparison: CT CAP 10/05/2019    CHEST FINDINGS:  Lower Neck: Unremarkable    Lymph nodes: No thoracic lymphadenopathy.     Heart, Mediastinum, and Great Vessels: Heart size is normal without pericardial effusion. Mild thoracic calcifications. Right internal jugular central venous port catheter with tip at the superior cavoatrial junction.    Airway, Lungs and Pleura: Unchanged small bilateral pulmonary nodules measuring up to 0.5 cm including a representative right upper lobe pulmonary nodule (series 7, image 23). Several unchanged fissural lymph nodes. New small right pleural effusion. No definite pleural nodularity. Associated subsegmental atelectasis. No pneumothorax. Central airways are patent.    Chest Wall and Osseous Structures: No destructive osseous lesions.    ABDOMEN AND PELVIS FINDINGS:    Liver and Biliary system: Enlarging and new numerous bilobar hepatic metastases. Previously measured hepatic dome mass measures up to 5.1 cm, previously 3.3 cm (series 6, image 8). Previously measured segment IVb lesion measures up to 3.3 cm, previously 2.2 cm. Previously measured caudate mass measures 3.2 cm, previously 1.2 cm (series 6, image 19). Major portal veins are narrowed, but patent within the liver. Gallbladder is unremarkable. No bile duct dilation.    Pancreas and Retroperitoneum: No substantial change in size of an ill-defined, infiltrative pancreatic head malignancy which occludes the portal splenic confluence and the proximal superior mesenteric vein with multiple collateral vessels. Persistent encasement of the superior mesenteric artery and celiac trifurcation is not substantially changed. Previously noted left renal vein thrombus has resolved. Distal pancreatic atrophy again noted.     Spleen: Unremarkable.    Adrenal Glands and Kidneys: Both adrenal glands are unremarkable. There is no hydronephrosis.    Pelvis: Bladder is partially decompressed. The uterus is unremarkable.    Bowel, Mesentery and Peritoneal space: Development of small volume ascites in the pelvis. No pneumoperitoneum. Mild colonic diverticulosis. Appendix is normal. Unchanged mild peritoneal thickening. No discrete omental or peritoneal nodules.    Aorta and Major Vessels: Moderate vascular calcifications. Normal caliber abdominal aorta.     Lymph nodes: Unchanged small subcentimeter peripancreatic lymph nodes no new abdominal, retroperitoneal, or or pelvic lymphadenopathy.    Abdominal wall and Osseous Structures: No destructive osseous lesions.  Impression: CHEST:  1.  Several stable subcentimeter bilateral pulmonary nodules which are indeterminate, but likely benign given continued stability dating back to May 2021.    2.  No thoracic lymphadenopathy.    ABDOMEN AND PELVIS:  1.  No substantial change in infiltrative pancreatic malignancy with extensive vascular involvement.    2.  New and enlarging bilobar hepatic metastases.    3.  Development of nonspecific trace pelvic ascites and mild peritoneal thickening.    4.  Unchanged small peripancreatic subcentimeter lymph nodes. No new abdominal pelvic lymphadenopathy.    5.  Resolved left renal vein thrombus.     Finalized by Elsie Amis, MD on 12/14/2019 12:05 PM. Dictated by Elsie Amis, MD on 12/14/2019 11:19 AM.  Assessment and Plan:  This is 74 y.o. female with the following problems:  1. 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  First-line therapy was given with FOLFIRINOX in May 2021 found to have progressive disease in October 2021.  She received second line therapy with Gemzar and Abraxane in October 2021 and found to have progressive disease in December 2021.  Per the note foundation 1 liquid CTX showed no reportable alterations with companion diagnostics.  However I do not have the full report and I would like to review this.  I reviewed the actual images of the CT chest abdomen pelvis done on 12/14/2019 and showed this to the patient and her son.  She does have significant involvement of the left.  Concerned about her performance status that she spends less than 50% of the time up and about during the day.  This would be ECOG 3.  According to patient she is quite fatigued after chemotherapy.  Since she is off chemotherapy it may be that her performance will improve.  If it does we will consider her for clinical trial.  At the moment I do not have a slot for the open clinical trials.  She agreed to be on the list for DSP  trial and [id number] trial.  If she does not hear from [place] in the next 2 weeks the patient will return back to Dr. Georgiann Hahn.  We will maximize all supportive measures.  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 Georgiann Hahn.  3. fatigue: multifactorial including chemotherapy and malignancy. Would maximize all supportive measures.  4. Liver metastasis as above.  5. Cancer related pain: This appears to be controlled with present medications.  6. Preventive medicine: The patient has completed COVID-19 vaccination.    The patient and her son were allowed to ask questions and these were addressed. They expressed understanding of what was explained to them and they agreed with the present plan.

## 2020-01-05 ENCOUNTER — Encounter: Admit: 2020-01-05 | Discharge: 2020-01-05 | Payer: MEDICARE

## 2020-01-06 ENCOUNTER — Encounter: Admit: 2020-01-06 | Discharge: 2020-01-06 | Payer: MEDICARE

## 2020-01-06 MED ORDER — FLUOROURACIL IV AMB PUMP
2400 mg/m2 | Freq: Once | INTRAVENOUS | 0 refills
Start: 2020-01-06 — End: ?

## 2020-01-06 MED ORDER — PALONOSETRON 0.25 MG/5 ML IV SOLN
.25 mg | Freq: Once | INTRAVENOUS | 0 refills
Start: 2020-01-06 — End: ?

## 2020-01-06 MED ORDER — DEXAMETHASONE 6 MG PO TAB
12 mg | Freq: Once | ORAL | 0 refills
Start: 2020-01-06 — End: ?

## 2020-01-06 MED ORDER — ATROPINE 0.4 MG/ML IJ SOLN
0.4 mg | INTRAVENOUS | 0 refills | PRN
Start: 2020-01-06 — End: ?

## 2020-01-06 MED ORDER — LEUCOVORIN IVPB
400 mg/m2 | Freq: Once | INTRAVENOUS | 0 refills
Start: 2020-01-06 — End: ?

## 2020-01-06 MED ORDER — IRINOTECAN LIPOSOMAL IVPB
70 mg/m2 | Freq: Once | INTRAVENOUS | 0 refills
Start: 2020-01-06 — End: ?

## 2020-01-06 NOTE — Telephone Encounter
Patient left message on RN line stating she was expecting a call from Dr. McKittrick's office this week and hadn't heard anything. Discussed with Dr. Georgiann Hahn. Our office will confirm status of clinical trial openings and if nothing proceeding, we will get her set up for next round of treatment with [place]. V/u

## 2020-01-06 NOTE — Progress Notes
PATIENT NOTIFIED 01/06/20@4 :55 ON CELL REGARDING [facility] LAB TEACH AND START DATE---RGL

## 2020-01-07 ENCOUNTER — Encounter: Admit: 2020-01-07 | Discharge: 2020-01-07 | Payer: MEDICARE

## 2020-01-07 DIAGNOSIS — Z20822 Encounter for screening laboratory testing for COVID-19 virus in asymptomatic patient: Secondary | ICD-10-CM

## 2020-01-13 ENCOUNTER — Encounter: Admit: 2020-01-13 | Discharge: 2020-01-13 | Payer: MEDICARE

## 2020-01-13 DIAGNOSIS — R42 Dizziness and giddiness: Secondary | ICD-10-CM

## 2020-01-13 DIAGNOSIS — Z719 Counseling, unspecified: Secondary | ICD-10-CM

## 2020-01-13 DIAGNOSIS — C259 Malignant neoplasm of pancreas, unspecified: Secondary | ICD-10-CM

## 2020-01-13 DIAGNOSIS — M549 Dorsalgia, unspecified: Secondary | ICD-10-CM

## 2020-01-13 DIAGNOSIS — G20 Parkinson's disease: Secondary | ICD-10-CM

## 2020-01-13 DIAGNOSIS — C250 Malignant neoplasm of head of pancreas: Secondary | ICD-10-CM

## 2020-01-13 LAB — CBC AND DIFF
Lab: 0.1 K/UL (ref 0–0.20)
Lab: 0.1 K/UL (ref 0–0.45)
Lab: 12 K/UL — ABNORMAL HIGH (ref 4.5–11.0)
Lab: 4.7 M/UL (ref 4.0–5.0)

## 2020-01-13 LAB — COMPREHENSIVE METABOLIC PANEL
Lab: 1.3 mg/dL — ABNORMAL HIGH (ref 0.4–1.00)
Lab: 1.4 mg/dL — ABNORMAL HIGH (ref 0.3–1.2)
Lab: 10 U/L — ABNORMAL HIGH (ref 7–56)
Lab: 104 U/L — ABNORMAL HIGH (ref 7–40)
Lab: 135 mg/dL — ABNORMAL HIGH (ref 70–100)
Lab: 143 MMOL/L (ref 137–147)
Lab: 15 K/UL — ABNORMAL HIGH (ref 3–12)
Lab: 2.8 g/dL — ABNORMAL LOW (ref 3.5–5.0)
Lab: 22 MMOL/L (ref 21–30)
Lab: 294 U/L — ABNORMAL HIGH (ref 25–110)
Lab: 3.8 MMOL/L (ref 3.5–5.1)
Lab: 36 mg/dL — ABNORMAL HIGH (ref 7–25)
Lab: 41 mL/min — ABNORMAL LOW (ref >60)
Lab: 5.6 g/dL — ABNORMAL LOW (ref 6.0–8.0)
Lab: 8.8 mg/dL (ref 8.5–10.6)

## 2020-01-13 LAB — CA19.9: Lab: 131 U/mL — ABNORMAL HIGH (ref <35)

## 2020-01-13 MED ORDER — LIDOCAINE 5 % TP PTMD
1 | MEDICATED_PATCH | Freq: Every day | TOPICAL | 3 refills | 30.00000 days | Status: AC
Start: 2020-01-13 — End: ?

## 2020-01-13 MED ORDER — ONDANSETRON HCL 8 MG PO TAB
8 mg | ORAL_TABLET | ORAL | 3 refills | 8.00000 days | Status: AC | PRN
Start: 2020-01-13 — End: ?

## 2020-01-13 NOTE — Progress Notes
Name: Cassandra Wallace          MRN: [id number]      DOB: 1945/02/10      AGE: 75 y.o.   DATE OF SERVICE: 01/13/2020    Subjective:             Reason for Visit:  Pt Ed      Cassandra Wallace is a 75 y.o. female.     Cancer Staging  Pancreatic cancer metastasized to liver [facility])  Staging form: Exocrine Pancreas, AJCC 8th Edition  - Clinical stage from 05/09/2019: Stage IV (cT4, cN0, pM1) - Signed by Lady Deutscher, MD on 05/09/2019      History of Present Illness    Cassandra Wallace is a patient of Dr. McKittrick's with the following history:    She has a history of abdominal pain, constipation and unintentional weight loss.  Unfortunately a CT  abdomen and pelvis done in [place] showed pancreatic lesions as well as 3 right hepatic lesions.  An MRI saw the same lesions.  An EUS performed on May 07, 2019 at [place] found to abnormal lesions in the left lobe of the liver and a pancreatic mass in the neck the pancreas that was invading the superior mesenteric artery. Biopsies of both came back consistent with adenocarcinoma. A CA 19-9 came back elevated at [facility].    --?Started?FOLFIRINOX ?on 05/20/19.  ?  -- CT on 07/20/19 showed stable disease. ?Incidental finding of portal vein thrombus. ?Pt started on Xarelto. ?  ?  -- CT on 10/05/19 saw several new and enlarging liver lesions.??  ?  -- Started Gemzar/Abraxane on 10/15/19  ?  --  CT CAP 12/14/19 showed new and enlarging bilobar liver mets    -- Plan liposomal irinotecan, leucovorin and fluorouracil.    Interval Events:   Cassandra Wallace presents to clinic for education about her treatment with liposomal irinotecan, fluorouracil and leucovorin. She is alone.    Medical History:   Diagnosis Date   ? Back pain    ? Pancreatic cancer (HCC)     mets to liver   ? Parkinson's disease (HCC) 2016     Surgical History:   Procedure Laterality Date   ? CATARACT REMOVAL Bilateral 2020   ? ESOPHAGOGASTRODUODENOSCOPY WITH TRANSENDOSCOPIC ULTRASOUND GUIDED FINE NEEDLE ASPIRATION/ BIOPSY N/A 05/07/2019    Performed by Comer Locket, MD at [facility] ENDO              Review of Systems      Objective:         ? carbidopa/levodopa (SINEMET) 25/100 mg tablet Take 1 tablet by mouth three times daily.   ? lidocaine/prilocaine (EMLA) 2.5/2.5 % topical cream Apply a thin film over port one hour prior to treatment. Do not rub in. Cover with plastic wrap after application   ? loperamide (IMODIUM A-D) 2 mg capsule Take 2 capsules by mouth after first loose/frequent bowel movement, then 1 capsule every 2 hours (2 capsules every 4 hours at night) until 12 hours have passed without a bowel movement.   ? ondansetron HCL (ZOFRAN) 8 mg tablet Take one tablet by mouth every 8 hours as needed for Nausea.   ? rivaroxaban (XARELTO) 20 mg tablet Take one tablet by mouth daily. Take with food.     Vitals:    01/13/20 0955   BP: 93/50   BP Source: Arm, Left Upper   Patient Position: Sitting   Pulse: 108   Resp: 18   Temp:  36.3 ?C (97.3 ?F)   TempSrc: Oral   SpO2: 100%   Weight: 70.4 kg (155 lb 3.2 oz)   PainSc: Six     Body mass index is 27.06 kg/m?Marland Kitchen     Pain Score: Six  Pain Loc: Back         Pain Addressed:  N/A    Patient Evaluated for a Clinical Trial: No treatment clinical trial available for this patient.     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 Started FOLFIRINOX on 05/20/19.  CT on 10/05/19 saw several new and enlarging liver lesions.??She was started on Gemzar/Abraxane on 10/15/19.  Follow up CT in December 2021 showed new and enlarging bilobar liver metastases.    -- Plan to start liposomal irinotecan, fluorouracil, and leucovorin.    APP Chemotherapy Education    IV Chemotherapy: The following is a summary of the patient's IV Chemotherapy Education.    A thorough pre-assessment and teaching session explaining the mechanism of action, possible side effects, precautions and instructions regarding liposomal irinotecan, leucovorin, and fluorouracil for palliative intent was conducted. The patient will return on 01/19/20 to initiate treatment. The cycle will repeat every 14 days unless disease progresses or unacceptable toxicity is reached. .  Plan of administration was reviewed.      Both written and verbal information were given to the patient.    The planned course of treatment, anticipated benefits, material risks and potential side effects that may occur with this course of treatment were explained to the patient.  Side effects and their management were discussed in detail and include, but are not limited to: Neutropenia, anemia, thrombocytopenia,  diarrhea, alopecia, nausea, vomiting, change in appetite, allergic reaction, hand/ foot syndrome, mucositis, nail changes, skin rash, decreased appetite, fatigue.     Reproductive concerns were not discussed with the patient  Infertility risks were not applicable and therefore not discussed    Appropriate handling of body secretions and waste at home were reviewed as applicable.    Prescriptions for supportive medications including zofran were e-scripted to their pharmacy and discussed in detail how to take. We discussed that she should imodium on hand to help should she get diarrhea.  Drug to drug interactions were reviewed as applicable.     The patient has received contact information for the clinic and was instructed on when and who to call.     The patient verbalized understanding, was given the opportunity to ask questions, and the consent form was signed.     Follow up appointment with nurse practitioner  One week after first treatment.   Lab in one week after first treatment.    This was a 30 minute face to face encounter with 30 minutes spent in counseling and coordination of care.    Dizziness: Blood pressure on the lower end of normal.  She also continues to report occasional dizziness.  I offered to give her IV fluids today but the patient declined. She said that she would try to drink more water.    Pedal edema: This could be due in part to low albumin.  I recommend elevating legs when seated, limiting sodium intake, and wearing compression stockings.  She should also continue to push fluids.  CINV: Mild nausea. Has zofran to use as needed.    RTC: NP visit 1 week after first treatment with MD prior to cycle 2.  Call sooner for questions or concerns.

## 2020-01-13 NOTE — Telephone Encounter
I called Cassandra Wallace to review the results of her CMP which showed elevated creatinine. I offered again to bring her in for IVF due to elevated creatinine. She declined again. I asked that she please push at LEAST 64 ounces of water but more would be better. She said that she would try. She also requested something for pain as her back has been hurting. I talked about tramadol or hydrocodone but she doesn't want something that strong. We discussed trying a lidocaine patch and she was willing to try that. I will send that to her pharmacy. I educated her to apply one patch daily but remove after 12 hours. She verbalized understanding.

## 2020-01-14 ENCOUNTER — Encounter: Admit: 2020-01-14 | Discharge: 2020-01-14 | Payer: MEDICARE

## 2020-01-14 NOTE — Telephone Encounter
Lidocaine patch 4% denied per medicare. Urgent appeal requested and information given to [facility]. REF # [id number]. Can take 72 hours for determination.

## 2020-01-17 ENCOUNTER — Encounter: Admit: 2020-01-17 | Discharge: 2020-01-18 | Payer: MEDICARE

## 2020-01-17 ENCOUNTER — Encounter: Admit: 2020-01-17 | Discharge: 2020-01-17 | Payer: MEDICARE

## 2020-01-18 DIAGNOSIS — Z20822 Contact with and (suspected) exposure to covid-19: Principal | ICD-10-CM

## 2020-01-19 ENCOUNTER — Encounter: Admit: 2020-01-19 | Discharge: 2020-01-19 | Payer: MEDICARE

## 2020-01-19 DIAGNOSIS — C259 Malignant neoplasm of pancreas, unspecified: Secondary | ICD-10-CM

## 2020-01-19 DIAGNOSIS — M549 Dorsalgia, unspecified: Secondary | ICD-10-CM

## 2020-01-19 DIAGNOSIS — G20 Parkinson's disease: Secondary | ICD-10-CM

## 2020-01-19 MED ORDER — SODIUM CHLORIDE 0.9 % IV SOLP
Freq: Once | INTRAVENOUS | 0 refills | Status: CP
Start: 2020-01-19 — End: ?
  Administered 2020-01-19: 16:00:00 1000.000 mL via INTRAVENOUS

## 2020-01-19 MED ORDER — DEXAMETHASONE 6 MG PO TAB
12 mg | Freq: Once | ORAL | 0 refills | Status: CP
Start: 2020-01-19 — End: ?
  Administered 2020-01-19: 17:00:00 12 mg via ORAL

## 2020-01-19 MED ORDER — ATROPINE 0.4 MG/ML IJ SOLN
0.4 mg | INTRAVENOUS | 0 refills | Status: AC | PRN
Start: 2020-01-19 — End: ?

## 2020-01-19 MED ORDER — IRINOTECAN LIPOSOMAL IVPB
70 mg/m2 | Freq: Once | INTRAVENOUS | 0 refills | Status: CP
Start: 2020-01-19 — End: ?
  Administered 2020-01-19 (×2): 116.917 mg via INTRAVENOUS

## 2020-01-19 MED ORDER — LEUCOVORIN IVPB
400 mg/m2 | Freq: Once | INTRAVENOUS | 0 refills | Status: CP
Start: 2020-01-19 — End: ?
  Administered 2020-01-19 (×2): 668 mg via INTRAVENOUS

## 2020-01-19 MED ORDER — PALONOSETRON 0.25 MG/5 ML IV SOLN
.25 mg | Freq: Once | INTRAVENOUS | 0 refills | Status: CP
Start: 2020-01-19 — End: ?
  Administered 2020-01-19: 17:00:00 0.25 mg via INTRAVENOUS

## 2020-01-19 MED ORDER — FLUOROURACIL IV AMB PUMP
2400 mg/m2 | Freq: Once | INTRAVENOUS | 0 refills | Status: CP
Start: 2020-01-19 — End: ?
  Administered 2020-01-19 (×2): 4008 mg via INTRAVENOUS

## 2020-01-19 NOTE — Progress Notes
Pt in tx for port access/labs and scheduled chemotherapy. Port accessed without difficulties. Labs drawn per order.   Lab results reviewed, pt c/o dizziness, weakness, pt states that she has not been taking enough oral fluids, as she is always tired and sleeping.    1 L NS infused as per Cathleen Fears APP's order. Pt agreeable to get chemotherapy treatment, voiced understanding of regimen and side effects.      Premeds given per plan.     CHEMO NOTE  Verified chemo consent signed and in chart.    Verified initiate chemo order in O2    Blood return positive via: Port (single)    BSA and dose double checked (agree with orders as written) with: see EMAR    Labs/applicable tests checked: cbc, cmp    Chemo regime: C1D1 Liposomal irinotecan+5FU+ Leucovorin    Rate verified and armband double checkwith second RN: Yes    Patient education offered and stated understanding. Denies questions at this time.      Port flushed, 5 FU in ambulatory pump connected to pt's port,  Pt tolerated the infusion well, left clinic in stable condition.

## 2020-01-20 ENCOUNTER — Encounter: Admit: 2020-01-20 | Discharge: 2020-01-20 | Payer: MEDICARE

## 2020-01-20 DIAGNOSIS — C259 Malignant neoplasm of pancreas, unspecified: Secondary | ICD-10-CM

## 2020-01-20 DIAGNOSIS — E860 Dehydration: Secondary | ICD-10-CM

## 2020-01-20 DIAGNOSIS — E876 Hypokalemia: Secondary | ICD-10-CM

## 2020-01-20 MED ORDER — POTASSIUM CHLORIDE IN 0.9%NACL 20 MEQ/L IV SOLP
1000 mL | Freq: Once | INTRAVENOUS | 0 refills
Start: 2020-01-20 — End: ?

## 2020-01-20 NOTE — Progress Notes
I saw Per the infusion nurses notes she has not been taking fluids very well.  Please check on her tomorrow.  Please also may be have her eat a banana a day.  I am a little bit leery to put her on potassium creatinine that high.  Please make sure we have a chemistry checked next week.

## 2020-01-21 ENCOUNTER — Encounter: Admit: 2020-01-21 | Discharge: 2020-01-21 | Payer: MEDICARE

## 2020-01-21 DIAGNOSIS — C259 Malignant neoplasm of pancreas, unspecified: Secondary | ICD-10-CM

## 2020-01-21 DIAGNOSIS — E860 Dehydration: Secondary | ICD-10-CM

## 2020-01-21 DIAGNOSIS — E876 Hypokalemia: Secondary | ICD-10-CM

## 2020-01-21 MED ORDER — HEPARIN, PORCINE (PF) 100 UNIT/ML IV SYRG
500 [IU] | Freq: Once | 0 refills | Status: CP
Start: 2020-01-21 — End: ?

## 2020-01-21 MED ORDER — POTASSIUM CHLORIDE IN 0.9%NACL 20 MEQ/L IV SOLP
1000 mL | Freq: Once | INTRAVENOUS | 0 refills | Status: CP
Start: 2020-01-21 — End: ?
  Administered 2020-01-21: 18:00:00 1000 mL via INTRAVENOUS

## 2020-01-21 NOTE — Patient Instructions
Call Immediately to report the following:  Uncontrolled nausea and/or vomiting, uncontrolled pain, or unusual bleeding.  Temperature of 100.4 F or greater and/or any sign/symptom of infection (redness, warmth, tenderness)  Painful mouth or difficulty swallowing  Red, cracked, or painful hands and/or feet  Diarrhea   Swelling of arms or legs  Rash    Important Phone Numbers:  [facility] Main Number (answered 24 hours a day) [phone]  Cancer Center Scheduling (appointments) [phone] OR [phone]  [facility] (for nutritional supplements) [phone]        Port Maintenance - If you have a port, it should be flushed every 6-8 weeks when not in use.  Please check with your MD, nurse, or the scheduler.

## 2020-01-21 NOTE — Progress Notes
Cycle 1 Day 3 DC pump after 46ml residual primed through port.    Pitting peripheral edema BLE - she reports they have been swollen since before the holidays.    Patient denies N/V/D - main complaint is fatigue.  Hates bananas but has been trying to eat.    71meq KCL given with 1L NS over 2 hours.    Hypotension improved with IVF.    Discharged to son in stable condition by wheelchair.

## 2020-01-22 ENCOUNTER — Encounter: Admit: 2020-01-22 | Discharge: 2020-01-22 | Payer: MEDICARE

## 2020-01-22 DIAGNOSIS — C259 Malignant neoplasm of pancreas, unspecified: Secondary | ICD-10-CM

## 2020-01-22 DIAGNOSIS — E876 Hypokalemia: Secondary | ICD-10-CM

## 2020-01-22 MED ORDER — POTASSIUM CHLORIDE 10 MEQ PO TBTQ
10 meq | ORAL_TABLET | Freq: Every day | ORAL | 0 refills | 30.00000 days | Status: AC
Start: 2020-01-22 — End: ?

## 2020-01-25 ENCOUNTER — Encounter: Admit: 2020-01-25 | Discharge: 2020-01-25 | Payer: MEDICARE

## 2020-01-25 DIAGNOSIS — E860 Dehydration: Secondary | ICD-10-CM

## 2020-01-25 DIAGNOSIS — C259 Malignant neoplasm of pancreas, unspecified: Secondary | ICD-10-CM

## 2020-01-25 MED ORDER — SODIUM CHLORIDE 0.9 % IV SOLP
1000 mL | Freq: Once | INTRAVENOUS | 0 refills
Start: 2020-01-25 — End: ?

## 2020-01-25 NOTE — Telephone Encounter
Son, Cassandra Wallace, called to discuss getting compression socks for Cassandra Wallace. He reports her legs are swollen, she is on Xarelto. He wanted to ensure that getting compression hose would be ok. He reports Cassandra Wallace is eating better, has been taking her oral potassium and has been drinking more fluid than last week. He thinks she is getting up better and using her walker. He reports that Cassandra Wallace and spouse are in  Assisted Living.     We will hold off on IV hydration until fully assessed today.

## 2020-01-25 NOTE — Telephone Encounter
PATIENT NOTIFIED 01/25/20@3 :50 ON CELL REGARDING LAB DIS/TOX---RGL

## 2020-01-25 NOTE — Telephone Encounter
Patient missed her appointment today. Called her and she reports that she forgot about it. Patient needs to be notified of appointment in enough time to arrange ride. She lives in assisted living. Marland Kitchen

## 2020-01-26 ENCOUNTER — Encounter: Admit: 2020-01-26 | Discharge: 2020-01-26 | Payer: MEDICARE

## 2020-01-27 ENCOUNTER — Encounter: Admit: 2020-01-27 | Discharge: 2020-01-27 | Payer: MEDICARE

## 2020-01-27 DIAGNOSIS — D696 Thrombocytopenia, unspecified: Secondary | ICD-10-CM

## 2020-01-27 DIAGNOSIS — E876 Hypokalemia: Secondary | ICD-10-CM

## 2020-01-27 DIAGNOSIS — E860 Dehydration: Secondary | ICD-10-CM

## 2020-01-27 DIAGNOSIS — G20 Parkinson's disease: Secondary | ICD-10-CM

## 2020-01-27 DIAGNOSIS — C259 Malignant neoplasm of pancreas, unspecified: Secondary | ICD-10-CM

## 2020-01-27 DIAGNOSIS — M549 Dorsalgia, unspecified: Secondary | ICD-10-CM

## 2020-01-27 LAB — COMPREHENSIVE METABOLIC PANEL
Lab: 1.3 mg/dL — ABNORMAL HIGH (ref 0.4–1.00)
Lab: 1.4 mg/dL — ABNORMAL HIGH (ref 0.3–1.2)
Lab: 105 MMOL/L (ref 98–110)
Lab: 116 mg/dL — ABNORMAL HIGH (ref 70–100)
Lab: 12 U/L (ref 7–56)
Lab: 134 MMOL/L — ABNORMAL LOW (ref 137–147)
Lab: 2.3 g/dL — ABNORMAL LOW (ref 3.5–5.0)
Lab: 21 MMOL/L (ref 21–30)
Lab: 272 U/L — ABNORMAL HIGH (ref 25–110)
Lab: 3.9 MMOL/L (ref 3.5–5.1)
Lab: 4.8 g/dL — ABNORMAL LOW (ref 6.0–8.0)
Lab: 43 mL/min — ABNORMAL LOW (ref >60)
Lab: 57 mg/dL — ABNORMAL HIGH (ref 7–25)
Lab: 61 U/L — ABNORMAL HIGH (ref 7–40)
Lab: 8 (ref 3–12)
Lab: 8.7 mg/dL (ref 8.5–10.6)

## 2020-01-27 LAB — CA19.9: Lab: 110 U/mL — ABNORMAL HIGH (ref <35)

## 2020-01-27 MED ORDER — SODIUM CHLORIDE 0.9 % IV SOLP
500 mL | Freq: Once | INTRAVENOUS | 0 refills | Status: CN
Start: 2020-01-27 — End: ?

## 2020-01-27 MED ORDER — HEPARIN, PORCINE (PF) 100 UNIT/ML IV SYRG
500 [IU] | Freq: Once | 0 refills | Status: CP
Start: 2020-01-27 — End: ?

## 2020-01-27 MED ORDER — SODIUM CHLORIDE 0.9 % IV SOLP
500 mL | Freq: Once | INTRAVENOUS | 0 refills | Status: CP
Start: 2020-01-27 — End: ?
  Administered 2020-01-27: 20:00:00 500 mL via INTRAVENOUS

## 2020-01-27 MED ORDER — SODIUM CHLORIDE 0.9 % IV SOLP
500 mL | Freq: Once | INTRAVENOUS | 0 refills | Status: CP
Start: 2020-01-27 — End: ?
  Administered 2020-01-27: 21:00:00 500 mL via INTRAVENOUS

## 2020-01-27 NOTE — Progress Notes
Pt here for port access for labs; evaluation by provider today  Bilateral LE pitting edema; wheelchair use; hypotensive  Upon NP evaluation, IVFs given and BP 109/71.  Port flushed and deaccessed per protocol;  Pt dismissed per wheelchair to assisted care facility [facility]

## 2020-01-27 NOTE — Progress Notes
PATIENT NOTIFIED 01/27/20@3 :09 ON CELL REGARDING LAB APPOINTMENT---RGL

## 2020-01-27 NOTE — Progress Notes
Name: Cassandra Wallace          MRN: [id number]      DOB: 10-11-1945      AGE: 75 y.o.   DATE OF SERVICE: 01/27/2020    Subjective:             Reason for Visit:  No chief complaint on file.      Cassandra Wallace is a 75 y.o. female.     Cancer Staging  Pancreatic cancer metastasized to liver [facility])  Staging form: Exocrine Pancreas, AJCC 8th Edition  - Clinical stage from 05/09/2019: Stage IV (cT4, cN0, pM1) - Signed by Lady Deutscher, MD on 05/09/2019      History of Present Illness  Cassandra Wallace is here today for toxicity check after beginning liposomal irinotecan + 5FU/LV last week for her pancreatic cancer.  Cassandra Wallace has a history of abdominal pain, constipation and unintentional weight loss. Unfortunately a CT  abdomen and pelvis done in [place], [place] showed pancreatic lesions as well as 3 right hepatic lesions. ?An MRI saw the same lesions. ?EUS performed on May 07, 2019 at [facility] found 2 abnormal lesions in the left lobe of the liver and a pancreatic mass in the neck of the pancreas that was invading the superior mesenteric artery. Biopsies of both came back consistent with adenocarcinoma.  CA 19-9 was elevated at [facility].  ?  --?Started?FOLFIRINOX ?on 05/20/19.  ?  -- CT on 07/20/19 showed stable disease. ?Incidental finding of portal vein thrombus. ?Pt started on Xarelto. ?  ?  -- CT on 10/05/19 saw several new and enlarging liver lesions.??  ?  -- Started Gemzar/Abraxane on 10/15/19  ?  -- ?CT CAP 12/14/19 showed new and enlarging bilobar liver mets  ?  -- started liposomal irinotecan, leucovorin and fluorouracil on 01/19/20.  ?  Interval Events:   Pt is fatigued.  Last week was really rough.  LEs edematous since after chemo.  Compression stockings on now.    Sx have improved considerably since last week.  Pt still fatigued and weak but able to eat/drink more.  Cassandra Wallace is up 11 lbs since last week.    Pt compliant with KCL QD as prescribed.    Denies unusual bleeding/bruising.    Mild constipation.         Review of Systems Constitutional: Positive for fatigue. Negative for fever.   HENT: Negative.  Negative for mouth sores.    Respiratory: Negative.  Negative for cough and shortness of breath.    Cardiovascular: Positive for leg swelling (since after chemo). Negative for chest pain and palpitations.   Gastrointestinal: Positive for constipation (mild). Negative for abdominal pain, blood in stool and nausea.   Genitourinary: Negative for hematuria.   Musculoskeletal: Negative.    Skin: Negative.    Neurological: Positive for weakness (mild/mod). Negative for numbness.   Hematological: Negative.  Does not bruise/bleed easily.   Psychiatric/Behavioral: Negative.    All other systems reviewed and are negative.        Objective:         ? carbidopa/levodopa (SINEMET) 25/100 mg tablet Take 1 tablet by mouth three times daily.   ? lidocaine (LIDODERM) 5 % topical patch Apply one patch topically to affected area daily. Apply patch for 12 hours, then remove for 12 hours before repeating.   ? lidocaine/prilocaine (EMLA) 2.5/2.5 % topical cream Apply a thin film over port one hour prior to treatment. Do not rub in. Cover with plastic wrap after application   ?  loperamide (IMODIUM A-D) 2 mg capsule Take 2 capsules by mouth after first loose/frequent bowel movement, then 1 capsule every 2 hours (2 capsules every 4 hours at night) until 12 hours have passed without a bowel movement.   ? ondansetron HCL (ZOFRAN) 8 mg tablet Take one tablet by mouth every 8 hours as needed for Nausea.   ? potassium chloride SR (KLOR-CON M10) 10 mEq tablet Take one tablet by mouth daily. Take with a meal and a full glass of water.  Indications: low amount of potassium in the blood   ? rivaroxaban (XARELTO) 20 mg tablet Take one tablet by mouth daily. Take with food.     There were no vitals filed for this visit.  There is no height or weight on file to calculate BMI.                  Pain Addressed:  N/A    Patient Evaluated for a Clinical Trial: No treatment clinical trial available for this patient.     [name] Cooperative Oncology Group performance status is 2, Ambulatory and capable of all selfcare but unable to carry out any work activities. Up and about more than 50% of waking hours.     Physical Exam  Vitals reviewed.   Constitutional:       Appearance: Normal appearance.   HENT:      Head: Normocephalic.   Cardiovascular:      Rate and Rhythm: Normal rate.   Pulmonary:      Effort: Pulmonary effort is normal.   Musculoskeletal:      Cervical back: Normal range of motion.   Skin:     General: Skin is warm and dry.      Coloration: Skin is pale.   Neurological:      General: No focal deficit present.      Mental Status: Cassandra Wallace is alert and oriented to person, place, and time.   Psychiatric:         Mood and Affect: Mood normal.         Behavior: Behavior normal.         Thought Content: Thought content normal.          Results for orders placed or performed during the hospital encounter of 01/27/20 (from the past 336 hour(s))   CBC AND DIFF   Result Value Ref Range    White Blood Cells 6.5 4.5 - 11.0 K/UL    RBC 4.27 4.0 - 5.0 M/UL    Hemoglobin 12.0 12.0 - 15.0 GM/DL    Hematocrit 16.1 36 - 45 %    MCV 87.6 80 - 100 FL    MCH 28.0 26 - 34 PG    MCHC 32.0 32.0 - 36.0 G/DL    RDW 09.6 (H) 11 - 15 %    Platelet Count 14 (LL) 150 - 400 K/UL    MPV 10.1 7 - 11 FL    Neutrophils 84 (H) 41 - 77 %    Lymphocytes 14 (L) 24 - 44 %    Monocytes 0 (L) 4 - 12 %    Eosinophils 1 0 - 5 %    Basophils 1 0 - 2 %    Absolute Neutrophil Count 5.50 1.8 - 7.0 K/UL    Absolute Lymph Count 0.90 (L) 1.0 - 4.8 K/UL    Absolute Monocyte Count 0.00 0 - 0.80 K/UL    Absolute Eosinophil Count 0.00 0 - 0.45 K/UL  Absolute Basophil Count 0.00 0 - 0.20 K/UL          Assessment and Plan:       Patient Active Problem List    Diagnosis Date Noted   ? Dehydration 01/27/2020   ? 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  Patient aware to call if bleeding or unusual bruising.  Has been on same medication and is fairly familiar.     ? 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 CINV (chemotherapy-induced nausea and vomiting) 05/26/2019   ? Dizziness 05/26/2019   ? Encounter for education 05/19/2019   ? Parkinson's disease (HCC) 05/11/2019   ? Pancreatic cancer metastasized to liver [facility]) 05/07/2019     04/13/2019 [facility] medical imaging [facility] CT abdomen pelvis with contrast: Abdominal pain constipation unintentional weight loss.  The head of the pancreas is 3 hypodense lesions.  The lesion towards the right lateral side measures 1.8 cm.  Anterior lesion the mild/mid head measures 11.4 cm.  There are 3 right hepatic lesions that are suspicious.  Impression: Multifocal pancreas lesion suspicious for tumor such as adenocarcinoma.  Could also be focal inflammation from subacute or chronic pancreatitis.  04/20/2019 [place] medical imaging [facility] MRI abdomen without and with contrast.  Indeterminate lesions in the pancreatic bed and proximal body.  Pancreatic neoplasm cannot be excluded.  This is compared to CT/12/2019.  Several right and left hepatic lesions are present.  The 2 largest are adjacent to 1 another.  Measuring 2.1 x 1.8 cm.  Concerning for hepatic metastatic lesions.  Heterogeneous process pancreatic head and proximal body the whole area measures approximately 5.2 x 3.5 cm.  It appears to be least some encasement of the associated narrowing of the superior mesenteric vein  04/23/2019 [facility] clinic note.  Patient had Covid in November 2020.  Recent imaging showed spots on pancreas.  History of Parkinson's weight 156 pounds  05/07/2019 [facility] EUS report examination of the left lobe of the liver revealed 2 lesions measuring 1 cm each.  FNA x4 was performed upper limit result is positive for malignant cells.  Examination of pancreatic revealed a mass in the neck the pancreas with invasion to SMA.  Fine-needle biopsy was obtained and preliminary is all is positive for malignant cells.  Await cytology results and await path results.  05/07/2019 CA 19-9 1782, H 12.3, platelets 120 low, W3.5 low, chemistries normal,  05/07/2019 [place] pathology/cytology liver US-FNA malignant cells present consistent with adenocarcinoma.  Pancreatic neck EUS FNA: Malignant cells present consistent with adenocarcinoma.  Mismatch repair proteins are ordered and will reported in an addendum.  Microsatellite instability test by PCR is also been ordered and will be reported separately.  Part A contain sufficient tissue for further testing.  05/11/2019 this is patient's first visit with [facility] oncology.  Husband Ron participated in person son Trey Paula who is a [employer], by phone.  Discussed biopsy results and also stage and prognosis of stage IV pancreatic cancer.  Talked about treatment options including FOLFIRINOX with growth factor, Gemzar and Abraxane, single agent Gemzar, no therapy/palliative care.  Patient is very good performance status.  Would suggest slightly dose reduced FOLFIRINOX with growth factor.  Patient will think about it but is willing to set things in motion for [employer] placement and chemotherapy and teaching etc.  They plan to move here  from [facility] in the near future.  05/14/2019 [place] CT chest multiple tiny bilateral pulmonary nodules suspicious for metastatic disease the likely 2 small for definitive PET/CT characterization or biopsy.  A follow-up chest CT in 2-3 months is suggested to assess for stability.  No thoracic adenopathy.  05/19/19: FOLFIRINOX education  05/20/19 Started modified FOLFIRINOX  05/26/19: Toxicity check: Tolerated first cycle relatively well. Minimal nausea and diarrhea. 1 liter of fluids in clinic today. Having back pain. Recommended trying some ibuprofen, heat and massage, reassess next week with Dr. Madison Hickman.  06/03/2019 cycle cycle 1 day 15 FOLFIRINOX appetite improving less back pain all very encouraging tumor marker pending lab work looks good.  Proceed with cycle 1 day 15 see nurse in 2 weeks physician in 4 weeks imaging after 2 months  06/16/19  C3 FOLFIRINOX.  Tolerating therapy very well.  Tumor marker continues to decrease.  FU with Dr. Madison Hickman in 2 weeks.    07/08/19  C4 FOLFIRINOX.  Improved appetite.  CT prior to FU with Dr. Georgiann Hahn in 2 weeks.  Note that tumor marker is rising.   07/20/2019 [place] CT CAP: Questionable nonocclusive filling defect in partially visualized right IJ vein.  May be from mixing artifact versus small clot.  No thoracic adenopathy.  No significant change in scattered indeterminate bilateral pulmonary nodules.  No significant change in size of infiltrative pancreatic malignancy since 05/14/2019.  Persistent encasement.  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  Additional small nonocclusive thrombus in the left renal vein.  Dominant right hepatic metastasis are not significantly changed since 05/14/2019.  Smaller scattered hepatic metastasis are decreased in size.  No new abdominal pelvic mass or ascites.  07/21/2019 cycle 5 FOLFIRINOX with Neulasta support.  Platelets 99,000.  Will decrease oxaliplatin further to 50 mg/metered squared.  Discussed clot with patient.  No bleeding reported.  Will begin Xarelto.  Husband is on same medications.  Patient aware and discussed risks and benefits.  Will return in 2 in 4 weeks for additional chemo.  08/11/19  Pt is 1 week later for C6 FOLFIRINOX due to severe diarrhea last week.  Sx have resolved.  Cassandra Wallace will take Imodium prn.  FU with Dr. Madison Hickman in 2 weeks.    09/09/19  C7 FOLFIRINOX at continued dose reductions for low PLT and diarrhea.  FU in 2 and 4 weeks.  If tumor marker continues to rise, plan to scan in October.    09/23/19  C8 FOLFIRINOX.  Pt tolerating chemo exceptionally well. Tumor marker is rising so will order CT CAP prior to FU with Dr. Madison Hickman in 2 weeks.    10/05/2019 [place] CT CAP: No thoracic adenopathy.  No appreciable change in indeterminate bilateral pulmonary nodules.  Slight interval decrease in size of pancreatic neoplasm.  Multifocal hepatic metastasis are overall increased in size and number from prior with one met decrease in size.  Persistent encasement and narrowing of the portal confluence with interval decrease in nonocclusive thrombus in the main portal vein and resolution of thrombus in the SMV.  No new abdominal pelvic lymphadenopathy or ascites.,,,,,, consider change to Gemzar Abraxane  10/06/2019 reviewed scans with patient which unfortunately show progressive disease. Talk about changing to Gemzar and Abraxane. After patient left realized we had not tested for mutations in BRCA1/PALB/NTRK/MSI stability. We will check into foundation 1 liquid CDX testing.  10/12/19  Gemzar/Abraxane education.  Start 10/15/19.  1 wk tox check.  Covid Moderna #3 today.    10/15/2019 cycle 1 day 1 Gemzar Abraxane  10/22/2019 cycle 1 day 8.  Tolerated very well.  Less nausea does have some slight constipation.  Will return next week and hope that counts are good for day 15 then see the nurse in 3 weeks for cycle 2-day 1.  Gave patient warning that we may go to day 1 day 8 skip 15 on a 21-day cycle if her counts do not allow this.  We will also need to order scan prior to cycle 3.  Tolerating well in general.  Cassandra Wallace plans on hopes that her grandchildren can come trick-or-treating at the facility where Cassandra Wallace lives.  10/22/2019 foundation 1 liquid CDX: No reportable alterations with companion diagnostic claims.  Blood tumor mutational burden 1 mutation/megabyte, MSI high not detected, genomic findings CDK 4 amplification, FGF R2 amplification, KRAS G 12 D in 19.6%,,,,,Note that the test also included testing for BRCA1/BRCA2, ATM, and PALB 2 among others listed on page 21 of 25 of the report  10/29/19 Defer C2D8 due to PLT 50K.  Repeat CBC next week.    11/19/19  C2D1 Gemzar/Abraxane.  Skip day 8 next week due to Thanksgiving holiday.  Return in 2 weeks for C2D15.  CT CAP just prior to FU with Dr. Madison Hickman in 4 weeks.    12/14/2019 [place] CT CAP: Several stable subcentimeter bilateral pulmonary nodules are indeterminate.  No thoracic adenopathy.  No substantial change in infiltrative pancreatic malignancy with extensive vascular involvement.  New and enlarging bilobar hepatic metastasis.  Development of nonspecific trace pelvic ascites.  Unchanged small peripancreatic subcentimeter lymph nodes.  Resolved left renal vein thrombosis.  12/17/19  Discussed CT scan showing progressive liver disease w/ the pt and her son.  DC Gem/Abraxane.  Consult [facility] for clinical trial otherwise, start liposomal irinotecan/CIVI 5FU/LV soon.    01/13/20: Liposomal irinotecan, leucovorin and fluorouracil education. Treatment to start on 01/19/20. BP lower end of normal; declined IVF. Pedal swelling: recommend elevating legs, watching sodium, compression stockings. Will see Imelda Pillow D8 for tox check; McK prior to C2.  01/27/20  Tox check:  Dehydration - 500cc NS today.  LE edema 3+.  Wait for serum creat level before ordering lasix.  Pt up 11 lbs since last week.  Hold Xarelto for PLT 14K.  Repeat lab in 2 days.  Colace for softer stools.  FU with Dr. Madison Hickman next week as scheduled.      Active therapy: Liposomal irinotecan, leucovorin, fluorouracil, and Xarelto    [place]              Dehydration - BP 78/48.  1000cc NS today.  Pt only drinking about 24 oz oral fluids daily per her report.  Encouraged 64 oz/day.      LE edema 3+.  TED hose applied.  Wait for serum creat level before ordering lasix.  Pt up 11 lbs since last week.  If creat acceptable, prescribe lasix 20mg  QD over the weekend.  Repeat lab early next week. Increase KCL to QD also on lasix days.      Thrombocytopenia 2/2 chemo.  Hold Xarelto for PLT 14K.  Repeat lab in 2 days.  Transfuse PLT for unusual bleeding or PLT <10K.      Constipation.  Colace for softer stools to prevent hemorrhoids and bleeding.      Pancreatic cancer.  C1 lipo irinotecan/5FU/LV was very difficult for the patient.  FU with Dr. Madison Hickman next week as scheduled.  Consider dose decreased based on poor PS after chemo and PLT 14k.

## 2020-01-28 ENCOUNTER — Encounter: Admit: 2020-01-28 | Discharge: 2020-01-28 | Payer: MEDICARE

## 2020-01-28 MED ORDER — FUROSEMIDE 20 MG PO TAB
ORAL_TABLET | Freq: Every morning | ORAL | 0 refills | 90.00000 days | Status: AC | PRN
Start: 2020-01-28 — End: ?

## 2020-01-29 ENCOUNTER — Encounter: Admit: 2020-01-29 | Discharge: 2020-01-29 | Payer: MEDICARE

## 2020-01-29 DIAGNOSIS — D696 Thrombocytopenia, unspecified: Secondary | ICD-10-CM

## 2020-01-29 LAB — CBC AND DIFF
Lab: 1 % — ABNORMAL LOW (ref 4–12)
Lab: 1.7 K/UL — ABNORMAL LOW (ref 4.5–11.0)
Lab: 10 g/dL — ABNORMAL LOW (ref 12.0–15.0)
Lab: 28 pg (ref 26–34)
Lab: 3.8 M/UL — ABNORMAL LOW (ref 4.0–5.0)
Lab: 32 g/dL (ref 32.0–36.0)
Lab: 33 % — ABNORMAL LOW (ref 36–45)
Lab: 58 % — ABNORMAL LOW (ref 41–77)
Lab: 87 FL (ref 80–100)

## 2020-01-30 ENCOUNTER — Encounter: Admit: 2020-01-30 | Discharge: 2020-01-30 | Payer: MEDICARE

## 2020-01-30 MED ORDER — LEUCOVORIN IVPB
400 mg/m2 | Freq: Once | INTRAVENOUS | 0 refills
Start: 2020-01-30 — End: ?

## 2020-01-30 MED ORDER — DEXAMETHASONE 6 MG PO TAB
12 mg | Freq: Once | ORAL | 0 refills
Start: 2020-01-30 — End: ?

## 2020-01-30 MED ORDER — PALONOSETRON 0.25 MG/5 ML IV SOLN
.25 mg | Freq: Once | INTRAVENOUS | 0 refills
Start: 2020-01-30 — End: ?

## 2020-01-30 MED ORDER — IRINOTECAN LIPOSOMAL IVPB
70 mg/m2 | Freq: Once | INTRAVENOUS | 0 refills
Start: 2020-01-30 — End: ?

## 2020-01-30 MED ORDER — ATROPINE 0.4 MG/ML IJ SOLN
0.4 mg | INTRAVENOUS | 0 refills | PRN
Start: 2020-01-30 — End: ?

## 2020-01-30 MED ORDER — FLUOROURACIL IV AMB PUMP
2400 mg/m2 | Freq: Once | INTRAVENOUS | 0 refills
Start: 2020-01-30 — End: ?

## 2020-01-31 ENCOUNTER — Encounter: Admit: 2020-01-31 | Discharge: 2020-01-31 | Payer: MEDICARE

## 2020-02-01 ENCOUNTER — Encounter: Admit: 2020-02-01 | Discharge: 2020-02-01 | Payer: MEDICARE

## 2020-02-01 NOTE — Telephone Encounter
Son, Merry Proud, had left message on RN line over the weekend stating his mother had fallen and thought she needed hospice. Called Merry Proud this morning. He reports that his mother died over the weekend. Notification sent to care team, appointments cancelled.

## 2020-02-02 DEATH — deceased

## 2020-05-02 ENCOUNTER — Encounter: Admit: 2020-05-02 | Discharge: 2020-05-02 | Payer: MEDICARE

## 2021-05-15 IMAGING — CT CT ABDOMEN W IV  ORAL CONTRAST
1 series · 14 of 32 positions shown, 18 images · non-contrast
Comparison: none

[Series 3: axial_w 5.0 b31s · axial · 0.88mm/px · z∈[-416,-21]mm · 14 of 88 slices shown, 18 images]
[im 6/88  soft-tissue]
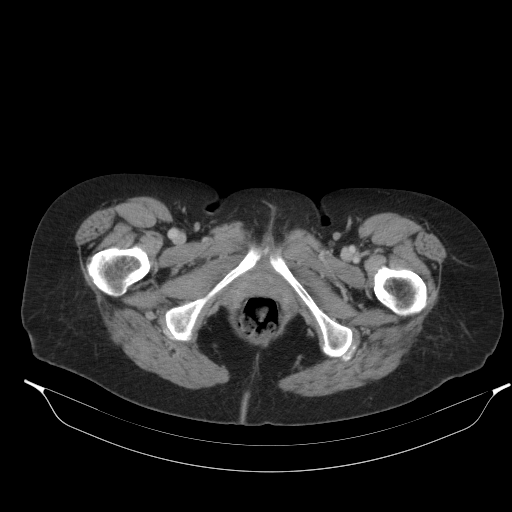
[im 6/88  bone]
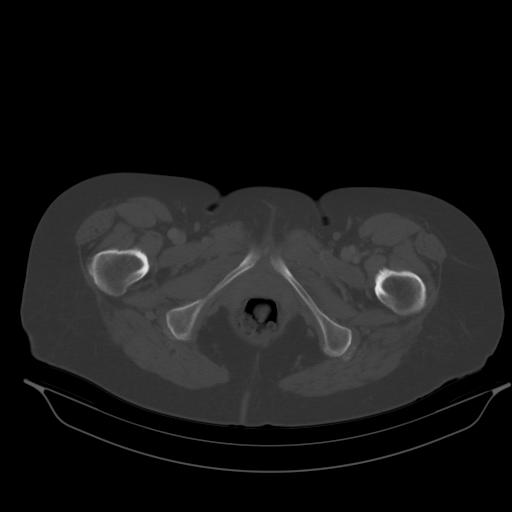
[im 12/88  soft-tissue]
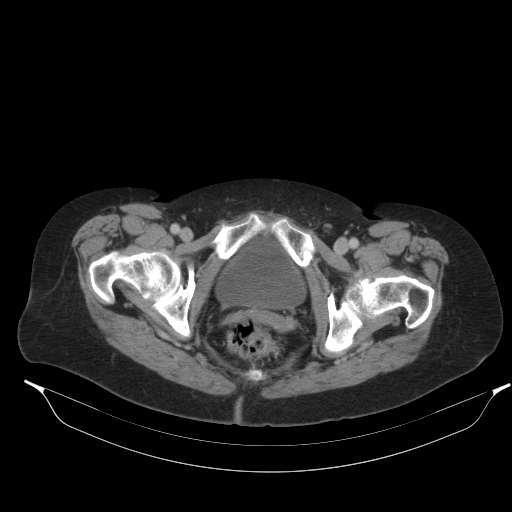
[im 20/88  soft-tissue]
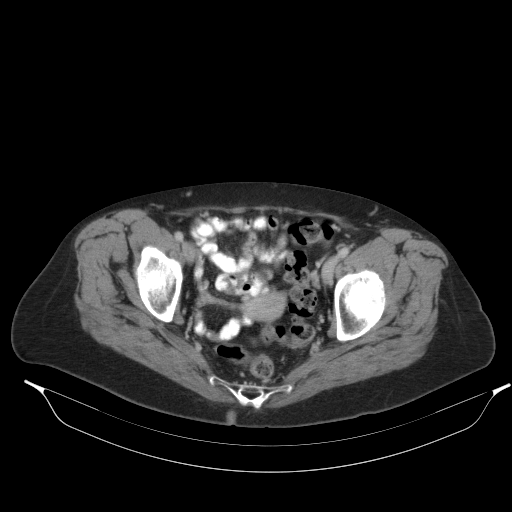
[im 26/88  soft-tissue]
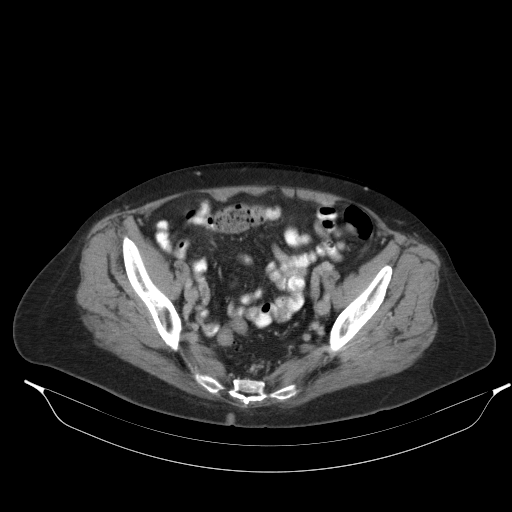
[im 34/88  soft-tissue]
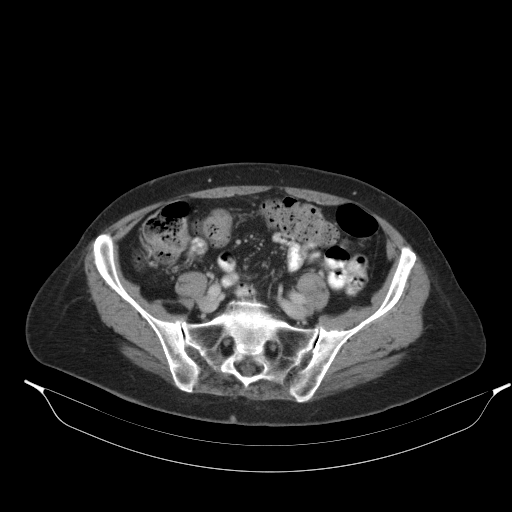
[im 40/88  soft-tissue]
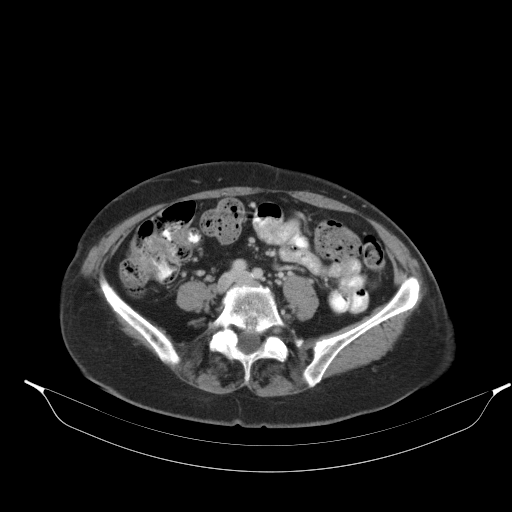
[im 48/88  soft-tissue]
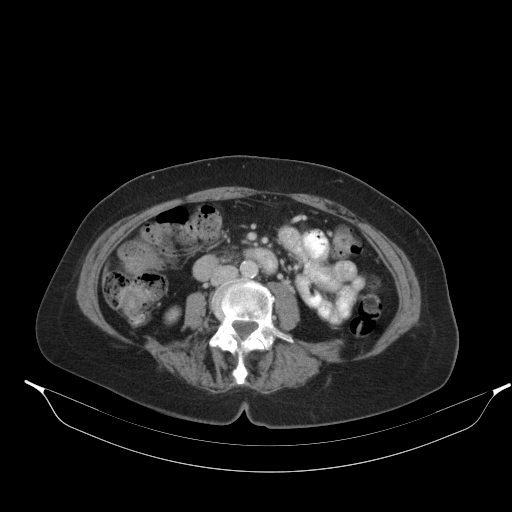
[im 54/88  soft-tissue]
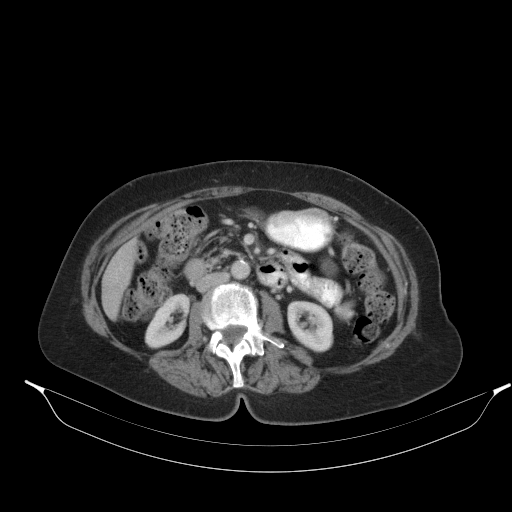
[im 62/88  soft-tissue]
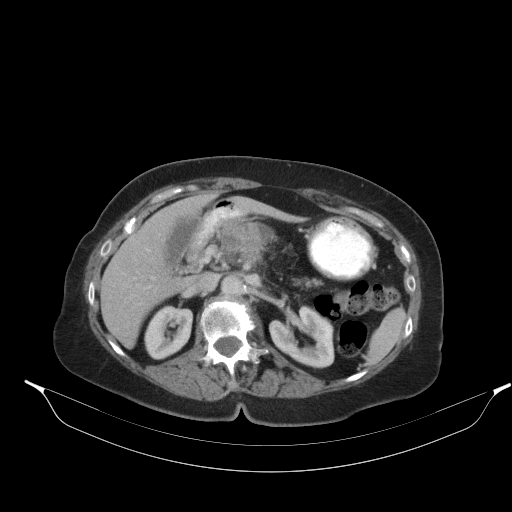
[im 62/88  bone]
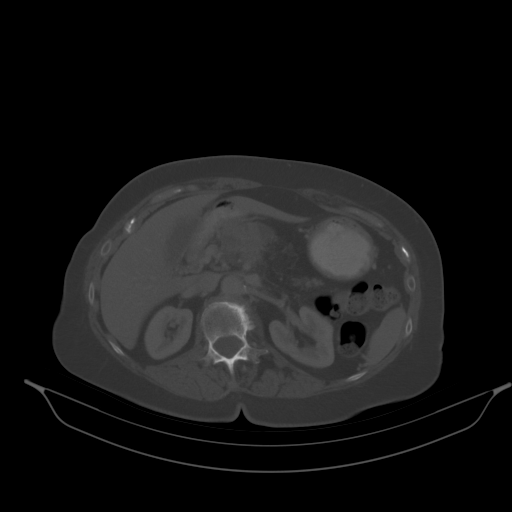
[im 68/88  soft-tissue]
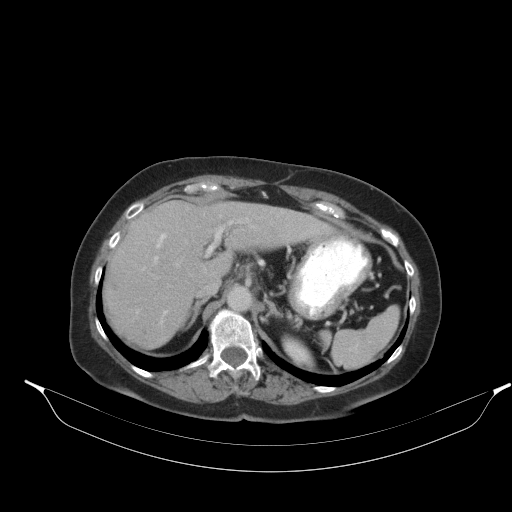
[im 76/88  soft-tissue]
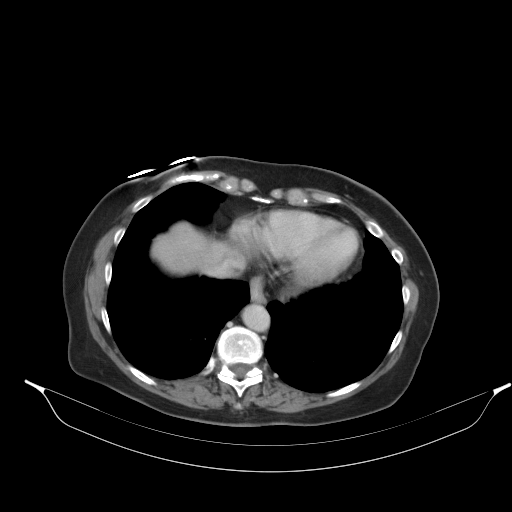
[im 76/88  lung]
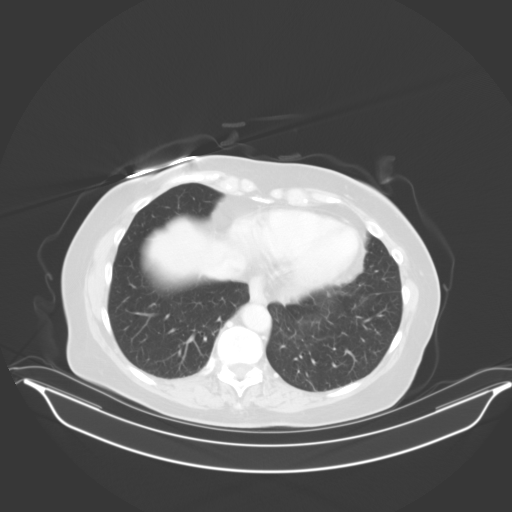
[im 79/88  lung]
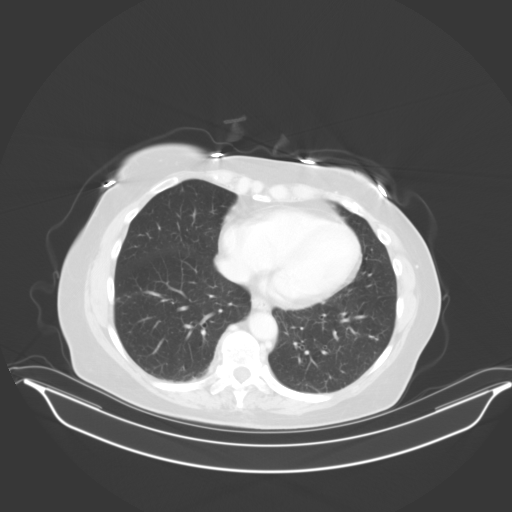
[im 82/88  soft-tissue]
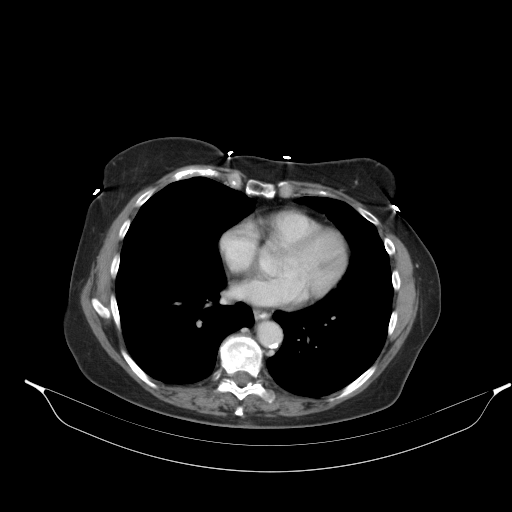
[im 82/88  lung]
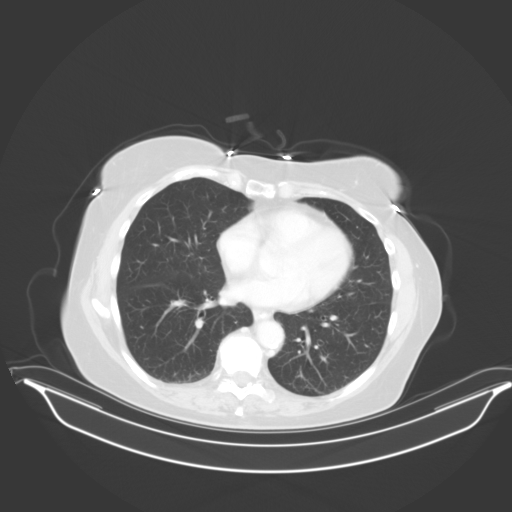
[im 85/88  lung]
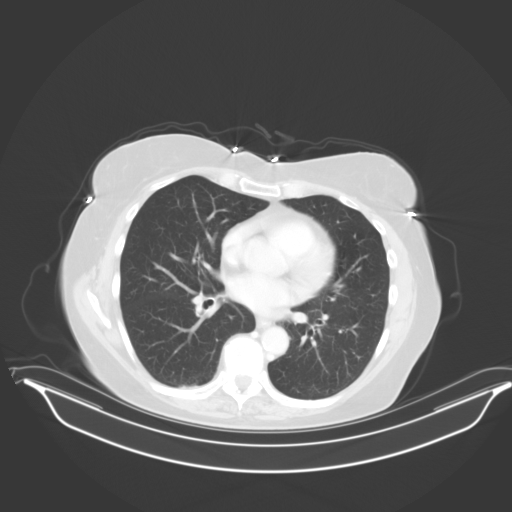

[14 of 32 positions shown; findings below may reference images not displayed]

DISCUSSION: Multiple CT images through the abdomen and
pelvis were done with contrast. Spleen is not enlarged. The
tail and body of pancreas have some fatty changes. The head
of the pancreas has 3 hypodense lesions. The 1 most towards
the right lateral side measures about 1.8 cm. The anterior
lesion in the mid head measures 11.4 cm. The posture mid
head lesion measures about 1.5 cm. These lesions are
indeterminate for foci of inflammation or multifocal tumor.
Abdomen MRI without and with contrast focusing on the
pancreas would help.
There are 3 right hepatic lesions that are suspicious for
tumor such as metastasis. In the upper posterior right
hepatic lobe 1 lesion measures 1.6 cm, the other measures 1.
4 cm. These are adjacent to each other. Right inferior
liver has a mass that measures about 1.8 cm. Anterior
midright a pedicle lobe has an 8 mm nodule. There are a few
other smaller vague nodular densities in the liver. These
are also suspicious for metastases. The pancreas head with
the multifocal lesion seems to be causing some compression
on the upper superior mesenteric vein. There is some
collateral vessels in this region towards the right lateral
side which could be from chronic compression.
Kidneys have no hydronephrosis. Left adrenal gland is
borderline enlarged at the body. Right adrenal gland
appears negative. Spleen is not enlarged. There few
collateral vessels in the upper abdomen above the adrenal
gland and around the proximal stomach.
Mild constipation is seen. There is minimal diverticulosis.
Urinary bladder is not very distended. Spine has some mild
degenerative changes and mild scoliosis.
Small left lower lobe pulmonary nodule as by the diaphragm
and measures 4 mm. The lower anterior right middle lobe as
a nodule measuring 4 mm. Followup chest CT would help to
look for other nodules as these could be metastases.
IMPRESSION
1.Multi focal pancreas lesion is suspicious for tumor such
as adenocarcinoma. Abdominal MRI without or with contrast
would provide more detail. These could also be foci of
inflammation from subacute or chronic pancreatitis. This
whole area is narrowing the distal superior mesenteric vein.
There is some adjacent collateral vessels. Pancreas tail
and body have moderate diffuse atrophy.
2. Hepatic lesions are suspicious for metastases, see above.
Some of the smaller ones are indeterminate.
3. Borderline enlargement of the left adrenal body.
4. Mild constipation.
5. Each lower lung has a small pulmonary nodule,
indeterminate. Chest CT would help.
6. Mild spine degenerative changes.

## 2022-02-04 ENCOUNTER — Encounter: Admit: 2022-02-04 | Discharge: 2022-02-04 | Payer: MEDICARE
